# Patient Record
Sex: Male | Born: 1965 | Race: Black or African American | Hispanic: No | Marital: Single | Smoking: Former smoker
Health system: Southern US, Community
[De-identification: ages and names within clinical notes are randomized; demographics above are authoritative.]

## PROBLEM LIST (undated history)

## (undated) DIAGNOSIS — I5022 Chronic systolic (congestive) heart failure: Secondary | ICD-10-CM

## (undated) DIAGNOSIS — N183 Chronic kidney disease, stage 3 unspecified: Secondary | ICD-10-CM

## (undated) DIAGNOSIS — I10 Essential (primary) hypertension: Secondary | ICD-10-CM

## (undated) DIAGNOSIS — I429 Cardiomyopathy, unspecified: Secondary | ICD-10-CM

## (undated) DIAGNOSIS — N200 Calculus of kidney: Secondary | ICD-10-CM

## (undated) HISTORY — PX: NO PAST SURGERIES: SHX2092

---

## 2008-11-27 ENCOUNTER — Emergency Department (HOSPITAL_COMMUNITY): Admission: EM | Admit: 2008-11-27 | Discharge: 2008-11-27 | Payer: Self-pay | Admitting: Emergency Medicine

## 2012-02-20 ENCOUNTER — Emergency Department (HOSPITAL_COMMUNITY)
Admission: EM | Admit: 2012-02-20 | Discharge: 2012-02-20 | Discharge status: Against Medical Advice | Payer: PRIVATE HEALTH INSURANCE | Attending: Emergency Medicine | Admitting: Emergency Medicine

## 2012-02-20 ENCOUNTER — Encounter (HOSPITAL_COMMUNITY): Payer: Self-pay | Admitting: Emergency Medicine

## 2012-02-20 DIAGNOSIS — R109 Unspecified abdominal pain: Secondary | ICD-10-CM | POA: Insufficient documentation

## 2012-02-20 DIAGNOSIS — N200 Calculus of kidney: Secondary | ICD-10-CM | POA: Insufficient documentation

## 2012-02-20 DIAGNOSIS — Z87442 Personal history of urinary calculi: Secondary | ICD-10-CM | POA: Insufficient documentation

## 2012-02-20 HISTORY — DX: Calculus of kidney: N20.0

## 2012-02-20 LAB — CBC WITH DIFFERENTIAL/PLATELET
Basophils Absolute: 0.1 10*3/uL (ref 0.0–0.1)
Eosinophils Relative: 2 % (ref 0–5)
HCT: 36.7 % — ABNORMAL LOW (ref 39.0–52.0)
Lymphocytes Relative: 39 % (ref 12–46)
MCHC: 32.4 g/dL (ref 30.0–36.0)
MCV: 66.8 fL — ABNORMAL LOW (ref 78.0–100.0)
Monocytes Absolute: 0.4 10*3/uL (ref 0.1–1.0)
Monocytes Relative: 6 % (ref 3–12)
RDW: 17 % — ABNORMAL HIGH (ref 11.5–15.5)
WBC: 6.1 10*3/uL (ref 4.0–10.5)

## 2012-02-20 LAB — BASIC METABOLIC PANEL
BUN: 18 mg/dL (ref 6–23)
CO2: 31 mEq/L (ref 19–32)
GFR calc non Af Amer: 54 mL/min — ABNORMAL LOW (ref >90)
Glucose, Bld: 135 mg/dL — ABNORMAL HIGH (ref 70–99)
Potassium: 2.7 mEq/L — CL (ref 3.5–5.1)

## 2012-02-20 LAB — URINALYSIS, ROUTINE W REFLEX MICROSCOPIC
Bilirubin Urine: NEGATIVE
Glucose, UA: 100 mg/dL — AB
Hgb urine dipstick: NEGATIVE
Specific Gravity, Urine: 1.021 (ref 1.005–1.030)
Urobilinogen, UA: 2 mg/dL — ABNORMAL HIGH (ref 0.0–1.0)
pH: 6.5 (ref 5.0–8.0)

## 2012-02-20 NOTE — ED Notes (Signed)
Patient not found in waiting.

## 2012-02-20 NOTE — ED Notes (Signed)
The pts  Pot was  Low was called  And he had just left.  Will call and get the pt to return or follow up with his doctor

## 2012-02-20 NOTE — ED Notes (Signed)
Pt c/o left flank pain x 1 hour; pt sts hx of kidney stone and feels same

## 2013-05-13 ENCOUNTER — Inpatient Hospital Stay (HOSPITAL_COMMUNITY)
Admission: EM | Admit: 2013-05-13 | Discharge: 2013-05-14 | DRG: 684 | Disposition: A | Discharge status: Home / Self Care | Payer: PRIVATE HEALTH INSURANCE | Attending: Internal Medicine | Admitting: Internal Medicine

## 2013-05-13 ENCOUNTER — Encounter (HOSPITAL_COMMUNITY): Payer: Self-pay | Admitting: Emergency Medicine

## 2013-05-13 ENCOUNTER — Emergency Department (HOSPITAL_COMMUNITY): Payer: PRIVATE HEALTH INSURANCE

## 2013-05-13 DIAGNOSIS — R200 Anesthesia of skin: Secondary | ICD-10-CM | POA: Diagnosis present

## 2013-05-13 DIAGNOSIS — F17200 Nicotine dependence, unspecified, uncomplicated: Secondary | ICD-10-CM | POA: Diagnosis present

## 2013-05-13 DIAGNOSIS — N289 Disorder of kidney and ureter, unspecified: Secondary | ICD-10-CM

## 2013-05-13 DIAGNOSIS — R202 Paresthesia of skin: Secondary | ICD-10-CM

## 2013-05-13 DIAGNOSIS — I509 Heart failure, unspecified: Secondary | ICD-10-CM | POA: Diagnosis present

## 2013-05-13 DIAGNOSIS — R209 Unspecified disturbances of skin sensation: Secondary | ICD-10-CM | POA: Diagnosis present

## 2013-05-13 DIAGNOSIS — I10 Essential (primary) hypertension: Secondary | ICD-10-CM | POA: Diagnosis present

## 2013-05-13 DIAGNOSIS — F1410 Cocaine abuse, uncomplicated: Secondary | ICD-10-CM | POA: Diagnosis present

## 2013-05-13 DIAGNOSIS — N189 Chronic kidney disease, unspecified: Secondary | ICD-10-CM | POA: Diagnosis present

## 2013-05-13 DIAGNOSIS — R0602 Shortness of breath: Secondary | ICD-10-CM | POA: Diagnosis present

## 2013-05-13 DIAGNOSIS — R0600 Dyspnea, unspecified: Secondary | ICD-10-CM

## 2013-05-13 DIAGNOSIS — F1210 Cannabis abuse, uncomplicated: Secondary | ICD-10-CM | POA: Diagnosis present

## 2013-05-13 DIAGNOSIS — I129 Hypertensive chronic kidney disease with stage 1 through stage 4 chronic kidney disease, or unspecified chronic kidney disease: Principal | ICD-10-CM | POA: Diagnosis present

## 2013-05-13 DIAGNOSIS — E876 Hypokalemia: Secondary | ICD-10-CM

## 2013-05-13 LAB — CBC WITH DIFFERENTIAL/PLATELET
Basophils Relative: 1 % (ref 0–1)
Eosinophils Relative: 4 % (ref 0–5)
HCT: 36.1 % — ABNORMAL LOW (ref 39.0–52.0)
Hemoglobin: 12.4 g/dL — ABNORMAL LOW (ref 13.0–17.0)
Lymphs Abs: 1.7 10*3/uL (ref 0.7–4.0)
MCH: 22.3 pg — ABNORMAL LOW (ref 26.0–34.0)
MCV: 64.8 fL — ABNORMAL LOW (ref 78.0–100.0)
Monocytes Absolute: 0.3 10*3/uL (ref 0.1–1.0)
Monocytes Relative: 5 % (ref 3–12)
RBC: 5.57 MIL/uL (ref 4.22–5.81)
WBC: 5.6 10*3/uL (ref 4.0–10.5)

## 2013-05-13 LAB — POCT I-STAT, CHEM 8
BUN: 19 mg/dL (ref 6–23)
Calcium, Ion: 1.21 mmol/L (ref 1.12–1.23)
Chloride: 101 mEq/L (ref 96–112)
Creatinine, Ser: 1.7 mg/dL — ABNORMAL HIGH (ref 0.50–1.35)
TCO2: 26 mmol/L (ref 0–100)

## 2013-05-13 MED ORDER — SODIUM CHLORIDE 0.9 % IV SOLN
INTRAVENOUS | Status: DC
  Administered 2013-05-14: 1000 mL via INTRAVENOUS

## 2013-05-13 NOTE — ED Notes (Signed)
MD at bedside. 

## 2013-05-13 NOTE — ED Notes (Addendum)
EKG completed and given to Dr. Denton Lank

## 2013-05-13 NOTE — ED Notes (Signed)
Pt states he has experienced SOB for past couple of weeks; states tonight he was lying down and lost feeling in right leg for about 5 mins.; denies hx heart problems. States he has Sob and fatigue when not walking very far distance. Denies major health issues. Pt states he is an occasional smoker.

## 2013-05-13 NOTE — ED Provider Notes (Addendum)
CSN: [id number]     Arrival date & time 05/13/13  2028 History   First MD Initiated Contact with Patient 05/13/13 2327     Chief Complaint  Patient presents with  . Shortness of Breath   (Consider location/radiation/quality/duration/timing/severity/associated sxs/prior Treatment) Patient is a 47 y.o. male presenting with shortness of breath. The history is provided by the patient.  Shortness of Breath Associated symptoms: no abdominal pain, no chest pain, no fever, no headaches, no neck pain, no rash, no sore throat and no vomiting   pt states was lying at home tonight, in somewhat awkward position, but then noticed right leg numbness.  Diffuse right leg. Lasted 2-3 minutes. No arm numbness/weakness. Pt denies headache. No change in speech or vision.  Pt states also notes dyspnea w exertion, fatigue for the past 1-2 weeks. Denies cp. Denies increased leg swelling/pain. No orthopnea or pnd. Denies hx cad or chf. No fam hx cad. Denies hx htn or taking bp meds previously. Denies drug use x thc.  No cocaine. Denies leg pain or swelling. No immobility, surgery, travel or hx dvt or pe.        Past Medical History  Diagnosis Date  . Kidney stone    History reviewed. No pertinent past surgical history. History reviewed. No pertinent family history. History  Substance Use Topics  . Smoking status: Current Some Day Smoker  . Smokeless tobacco: Not on file  . Alcohol Use: No    Review of Systems  Constitutional: Negative for fever and chills.  HENT: Negative for sore throat.   Eyes: Negative for visual disturbance.  Respiratory: Positive for shortness of breath.   Cardiovascular: Negative for chest pain, palpitations and leg swelling.  Gastrointestinal: Negative for vomiting and abdominal pain.  Genitourinary: Negative for flank pain.  Musculoskeletal: Negative for back pain and neck pain.  Skin: Negative for rash.  Neurological: Positive for numbness. Negative for weakness and  headaches.  Hematological: Does not bruise/bleed easily.  Psychiatric/Behavioral: Negative for confusion.    Allergies  Review of patient's allergies indicates no known allergies.  Home Medications  No current outpatient prescriptions on file. BP 189/125  Pulse 100  Temp(Src) 98.8 F (37.1 C) (Oral)  Resp 18  Ht 5\' 11"  (1.803 m)  Wt 245 lb (111.131 kg)  BMI 34.19 kg/m2  SpO2 98% Physical Exam  Nursing note and vitals reviewed. Constitutional: He is oriented to person, place, and time. He appears well-developed and well-nourished. No distress.  HENT:  Head: Atraumatic.  Eyes: Conjunctivae are normal. Pupils are equal, round, and reactive to light.  Neck: Neck supple. No tracheal deviation present. No thyromegaly present.  No bruit  Cardiovascular: Normal rate, regular rhythm, normal heart sounds and intact distal pulses.   Pulmonary/Chest: Effort normal and breath sounds normal. No accessory muscle usage. No respiratory distress.  Abdominal: Soft. Bowel sounds are normal. He exhibits no distension. There is no tenderness.  Musculoskeletal: Normal range of motion. He exhibits no edema and no tenderness.  Neurological: He is alert and oriented to person, place, and time. No cranial nerve deficit.  No pronator drift. Motor 5/5 bil upper and lower ext. Sym reflexes.   Skin: Skin is warm and dry. He is not diaphoretic.  Psychiatric: He has a normal mood and affect.    ED Course  Procedures (including critical care time)   Results for orders placed during the hospital encounter of 05/13/13  CBC WITH DIFFERENTIAL      Result Value Range  WBC 5.6  4.0 - 10.5 K/uL   RBC 5.57  4.22 - 5.81 MIL/uL   Hemoglobin 12.4 (*) 13.0 - 17.0 g/dL   HCT 16.1 (*) 09.6 - 04.5 %   MCV 64.8 (*) 78.0 - 100.0 fL   MCH 22.3 (*) 26.0 - 34.0 pg   MCHC 34.3  30.0 - 36.0 g/dL   RDW 40.9 (*) 81.1 - 91.4 %   Platelets 240  150 - 400 K/uL   Neutrophils Relative % 60  43 - 77 %   Lymphocytes Relative  30  12 - 46 %   Monocytes Relative 5  3 - 12 %   Eosinophils Relative 4  0 - 5 %   Basophils Relative 1  0 - 1 %   Neutro Abs 3.3  1.7 - 7.7 K/uL   Lymphs Abs 1.7  0.7 - 4.0 K/uL   Monocytes Absolute 0.3  0.1 - 1.0 K/uL   Eosinophils Absolute 0.2  0.0 - 0.7 K/uL   Basophils Absolute 0.1  0.0 - 0.1 K/uL   Smear Review MORPHOLOGY UNREMARKABLE    TROPONIN I      Result Value Range   Troponin I <0.30  <0.30 ng/mL  PRO B NATRIURETIC PEPTIDE      Result Value Range   Pro B Natriuretic peptide (BNP) 1513.0 (*) 0 - 125 pg/mL  URINE RAPID DRUG SCREEN (HOSP PERFORMED)      Result Value Range   Opiates NONE DETECTED  NONE DETECTED   Cocaine POSITIVE (*) NONE DETECTED   Benzodiazepines NONE DETECTED  NONE DETECTED   Amphetamines NONE DETECTED  NONE DETECTED   Tetrahydrocannabinol NONE DETECTED  NONE DETECTED   Barbiturates NONE DETECTED  NONE DETECTED  POCT I-STAT, CHEM 8      Result Value Range   Sodium 143  135 - 145 mEq/L   Potassium 3.0 (*) 3.5 - 5.1 mEq/L   Chloride 101  96 - 112 mEq/L   BUN 19  6 - 23 mg/dL   Creatinine, Ser 7.82 (*) 0.50 - 1.35 mg/dL   Glucose, Bld 956 (*) 70 - 99 mg/dL   Calcium, Ion 2.13  0.86 - 1.23 mmol/L   TCO2 26  0 - 100 mmol/L   Hemoglobin 14.3  13.0 - 17.0 g/dL   HCT 57.8  46.9 - 62.9 %   Dg Chest 2 View  05/13/2013   CLINICAL DATA:  Shortness of breath  EXAM: CHEST  2 VIEW  COMPARISON:  None.  FINDINGS: The lungs are clear. Heart is upper normal in size with normal pulmonary vascularity. No adenopathy. No bone lesions.  IMPRESSION: No edema or consolidation.   Electronically Signed   By: Bretta Bang M.D.   On: 05/13/2013 21:12   Ct Head Wo Contrast  05/14/2013   CLINICAL DATA:  Increasing shortness of breath. Right leg numbness. Difficulty walking.  EXAM: CT HEAD WITHOUT CONTRAST  TECHNIQUE: Contiguous axial images were obtained from the base of the skull through the vertex without intravenous contrast.  COMPARISON:  No priors.  FINDINGS: No acute  intracranial abnormalities. Specifically, no evidence of acute intracranial hemorrhage, no definite findings of acute/subacute cerebral ischemia, no mass, mass effect, hydrocephalus or abnormal intra or extra-axial fluid collections. Visualized paranasal sinuses and mastoids are well pneumatized, with exception of some mild mucosal thickening throughout the posterior ethmoid sinuses bilaterally. No acute displaced skull fractures are identified.  IMPRESSION: * No acute intracranial abnormalities. *The appearance of the brain is normal.  Electronically Signed   By: Trudie Reed M.D.   On: 05/14/2013 01:39      EKG Interpretation   None       MDM   Iv ns. Labs. Ct.   Reviewed nursing notes and prior charts for additional history.    Date: 05/13/2013  Rate: 103  Rhythm: sinus tachycardia  QRS Axis: left  Intervals: normal  ST/T Wave abnormalities: nonspecific ST/T changes  Conduction Disutrbances:left anterior fascicular block  Narrative Interpretation:   Old EKG Reviewed: none available   Unclear whether earlier, brief, neuro symptoms (right leg numbness) related to tia, vs positional/nerve compression as pt notes was in awkward position at time.  Symptoms have resolved. No current neuro c/o.  Pt w uncontrolled bp.  Hx renal insuff on prior labs, sl higher today.   uds pos cocaine.    Recheck bp still elev.  Hydralazine iv, ntg paste, ativan 1 mg iv.  Med service called to admit.   Pt had earlier agreed but now refusing admission.  hospitalist indicates he tried to persuade to stay, discussed risks, but pt refusing to stay.   I again (second occasion) discussed reasons for admission w pt, and risks of leaving AMA, including stroke, permanent neurologist deficit/paralysis, heart attack, worsening kidney function/permanent kidney damage/dialyses, and death.   Pt voices understanding of risk, reason for recommendation of admission, but refuses to stay either in hospital or  in ED.  Pt is alert, oriented, and comprehends risks of leaving, but persists in refusing to stay, requesting release.  Will sign out ama and give rx bp med and referral for close follow up, to return to ED right away if reconsiders, willing to stay, etc.    Suzi Roots, MD 05/14/13 1610  Suzi Roots, MD 05/14/13 0425

## 2013-05-13 NOTE — ED Notes (Signed)
Patient states he is just not feeling right.  Patient states that he has been having increased shortness of breath, especially with exercion, which is not normal for him.  Today, right leg went numb on him, he was having some trouble walking.  Patient now with resolved symptoms of the leg numbness.

## 2013-05-14 ENCOUNTER — Emergency Department (HOSPITAL_COMMUNITY): Payer: PRIVATE HEALTH INSURANCE

## 2013-05-14 ENCOUNTER — Encounter (HOSPITAL_COMMUNITY): Payer: Self-pay | Admitting: Radiology

## 2013-05-14 ENCOUNTER — Inpatient Hospital Stay (HOSPITAL_COMMUNITY): Payer: PRIVATE HEALTH INSURANCE

## 2013-05-14 DIAGNOSIS — I10 Essential (primary) hypertension: Secondary | ICD-10-CM

## 2013-05-14 DIAGNOSIS — R0602 Shortness of breath: Secondary | ICD-10-CM | POA: Diagnosis present

## 2013-05-14 DIAGNOSIS — R209 Unspecified disturbances of skin sensation: Secondary | ICD-10-CM

## 2013-05-14 DIAGNOSIS — N289 Disorder of kidney and ureter, unspecified: Secondary | ICD-10-CM

## 2013-05-14 DIAGNOSIS — R200 Anesthesia of skin: Secondary | ICD-10-CM | POA: Diagnosis present

## 2013-05-14 DIAGNOSIS — R0609 Other forms of dyspnea: Secondary | ICD-10-CM

## 2013-05-14 DIAGNOSIS — F1410 Cocaine abuse, uncomplicated: Secondary | ICD-10-CM

## 2013-05-14 LAB — COMPREHENSIVE METABOLIC PANEL
Albumin: 3.3 g/dL — ABNORMAL LOW (ref 3.5–5.2)
Alkaline Phosphatase: 67 U/L (ref 39–117)
BUN: 15 mg/dL (ref 6–23)
Calcium: 8.8 mg/dL (ref 8.4–10.5)
GFR calc Af Amer: 71 mL/min — ABNORMAL LOW (ref >90)
Glucose, Bld: 122 mg/dL — ABNORMAL HIGH (ref 70–99)
Potassium: 3 mEq/L — ABNORMAL LOW (ref 3.5–5.1)
Total Protein: 6.8 g/dL (ref 6.0–8.3)

## 2013-05-14 LAB — LIPID PANEL
Cholesterol: 136 mg/dL (ref 0–200)
HDL: 49 mg/dL (ref >39)
LDL Cholesterol: 79 mg/dL (ref 0–99)
Triglycerides: 39 mg/dL (ref <150)

## 2013-05-14 LAB — CBC WITH DIFFERENTIAL/PLATELET
Basophils Absolute: 0.1 10*3/uL (ref 0.0–0.1)
HCT: 35.6 % — ABNORMAL LOW (ref 39.0–52.0)
Lymphocytes Relative: 26 % (ref 12–46)
MCH: 22.1 pg — ABNORMAL LOW (ref 26.0–34.0)
MCHC: 34 g/dL (ref 30.0–36.0)
Monocytes Absolute: 0.3 10*3/uL (ref 0.1–1.0)
Monocytes Relative: 6 % (ref 3–12)
Neutro Abs: 3.6 10*3/uL (ref 1.7–7.7)
RDW: 17.1 % — ABNORMAL HIGH (ref 11.5–15.5)
WBC: 5.7 10*3/uL (ref 4.0–10.5)

## 2013-05-14 LAB — URINE MICROSCOPIC-ADD ON

## 2013-05-14 LAB — URINALYSIS, ROUTINE W REFLEX MICROSCOPIC
Bilirubin Urine: NEGATIVE
Glucose, UA: 100 mg/dL — AB
Leukocytes, UA: NEGATIVE
Nitrite: NEGATIVE
Specific Gravity, Urine: 1.014 (ref 1.005–1.030)
pH: 7.5 (ref 5.0–8.0)

## 2013-05-14 LAB — TSH: TSH: 1.08 u[IU]/mL (ref 0.350–4.500)

## 2013-05-14 LAB — RAPID URINE DRUG SCREEN, HOSP PERFORMED
Amphetamines: NOT DETECTED
Barbiturates: NOT DETECTED
Benzodiazepines: NOT DETECTED
Cocaine: POSITIVE — AB

## 2013-05-14 LAB — PRO B NATRIURETIC PEPTIDE: Pro B Natriuretic peptide (BNP): 1513 pg/mL — ABNORMAL HIGH (ref 0–125)

## 2013-05-14 LAB — TROPONIN I: Troponin I: <0.3 ng/mL (ref <0.30)

## 2013-05-14 MED ORDER — ONDANSETRON HCL 4 MG PO TABS: 4.0000 mg | ORAL_TABLET | Freq: Four times a day (QID) | ORAL | Status: DC | PRN

## 2013-05-14 MED ORDER — AMLODIPINE BESYLATE 10 MG PO TABS: 10.0000 mg | ORAL_TABLET | Freq: Every day | ORAL | Status: DC

## 2013-05-14 MED ORDER — NITROGLYCERIN 0.4 MG SL SUBL: 0.4000 mg | SUBLINGUAL_TABLET | SUBLINGUAL | Status: DC | PRN

## 2013-05-14 MED ORDER — ONDANSETRON HCL 4 MG/2ML IJ SOLN: 4.0000 mg | Freq: Four times a day (QID) | INTRAMUSCULAR | Status: DC | PRN

## 2013-05-14 MED ORDER — LORAZEPAM 2 MG/ML IJ SOLN
1.0000 mg | Freq: Once | INTRAMUSCULAR | Status: AC
  Administered 2013-05-14: 1 mg via INTRAVENOUS
  Filled 2013-05-14: qty 1

## 2013-05-14 MED ORDER — POTASSIUM CHLORIDE CRYS ER 20 MEQ PO TBCR
40.0000 meq | EXTENDED_RELEASE_TABLET | Freq: Once | ORAL | Status: AC
  Administered 2013-05-14: 40 meq via ORAL
  Filled 2013-05-14: qty 2

## 2013-05-14 MED ORDER — LORAZEPAM 2 MG/ML IJ SOLN: 1.0000 mg | Freq: Once | INTRAMUSCULAR | Status: DC

## 2013-05-14 MED ORDER — AMLODIPINE BESYLATE 10 MG PO TABS
10.0000 mg | ORAL_TABLET | Freq: Every day | ORAL | Status: DC
  Administered 2013-05-14: 10 mg via ORAL
  Filled 2013-05-14: qty 1

## 2013-05-14 MED ORDER — AMLODIPINE BESYLATE 5 MG PO TABS
5.0000 mg | ORAL_TABLET | Freq: Every day | ORAL | Status: DC
  Filled 2013-05-14 (×2): qty 1

## 2013-05-14 MED ORDER — HYDRALAZINE HCL 20 MG/ML IJ SOLN: 10.0000 mg | INTRAMUSCULAR | Status: DC | PRN

## 2013-05-14 MED ORDER — SODIUM CHLORIDE 0.9 % IJ SOLN
3.0000 mL | Freq: Two times a day (BID) | INTRAMUSCULAR | Status: DC
  Administered 2013-05-14: 3 mL via INTRAVENOUS

## 2013-05-14 MED ORDER — NITROGLYCERIN 2 % TD OINT
1.0000 [in_us] | TOPICAL_OINTMENT | Freq: Four times a day (QID) | TRANSDERMAL | Status: DC
  Administered 2013-05-14: 1 [in_us] via TOPICAL
  Filled 2013-05-14: qty 1

## 2013-05-14 MED ORDER — SODIUM CHLORIDE 0.9 % IJ SOLN: 3.0000 mL | Freq: Two times a day (BID) | INTRAMUSCULAR | Status: DC

## 2013-05-14 MED ORDER — ACETAMINOPHEN 325 MG PO TABS: 650.0000 mg | ORAL_TABLET | Freq: Four times a day (QID) | ORAL | Status: DC | PRN

## 2013-05-14 MED ORDER — ACETAMINOPHEN 650 MG RE SUPP: 650.0000 mg | Freq: Four times a day (QID) | RECTAL | Status: DC | PRN

## 2013-05-14 MED ORDER — HYDRALAZINE HCL 20 MG/ML IJ SOLN
10.0000 mg | Freq: Once | INTRAMUSCULAR | Status: AC
  Administered 2013-05-14: 10 mg via INTRAVENOUS
  Filled 2013-05-14: qty 1

## 2013-05-14 NOTE — ED Notes (Signed)
Pt now would like to stay for admission. Dr. Kirtland Bouchard made aware. Verbal order for 1mg  ativan given.

## 2013-05-14 NOTE — Progress Notes (Addendum)
Patient left AMA. Dr. Isidoro Donning notified.

## 2013-05-14 NOTE — Discharge Summary (Signed)
Physician  - AMA NOTE      Please note that patient left AGAINST MEDICAL ADVICE   Patient ID: Derek Chapman MRN: [id number] DOB/AGE: 1965-11-14 47 y.o.  Admit date: 05/13/2013 Discharge date: Left against medical advice  Primary Care Physician:  No PCP Per Patient  Discharge Diagnoses:    . SOB (shortness of breath) . Numbness in right leg . Accelerated hypertension Polysubstance abuse, cocaine positive in urine drug screen  Consults: None  Allergies:  No Known Allergies   Discharge Medications: NONE, PATIENT LEFT AMA     Brief H and P: For complete details please refer to admission H and P, but in brief Derek Chapman is a 47 y.o. male history of hypertension on no medications presently presented to the ER because patient felt numb in his right lower extremity with mild weakness last night around 9 PM. Patient's numbness and weakness of his right lower extremity resolved within 5 minutes. Since his symptoms was concerning he came to the ER. CT head did not show anything acute. In addition patient stated he had been feeling short of breath for last 3 weeks particularly on exertion. Denied any chest pain productive cough fever chills nausea vomiting headache abdominal pain. In the ER BNP is found to be elevated. Patient's blood pressure also was found to be elevated which improved with nitroglycerin patch and hydralazine when necessary and Ativan was given as patient was found to have cocaine positive. 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 BNP was elevated  at 1513. BP was controlled with nitroglycerin patch, hydralazine and amlodipine. 2-D echo was ordered however patient did not stay for the procedures. Patient was explained the risk of heart attack, MI, death with his continued behavior, cocaine positive, accelerated  hypertension without treatment and no followup as outpatient.      Numbness in right leg: Symptoms resolved, Dr. Toniann Fail discussed with the Dr. Roseanne Reno from neurology who recommended MRI of the brain to rule out stroke however since his symptoms are resolved, it could be due to accelerated hypertension, patient did not want the MRI and left AMA. Dr. Roseanne Reno had mentioned that if MRI is positive for stroke patient will need full stroke workup. Patient was explained the risk of possible stroke with his polysubstance abuse, hyper tension    Accelerated hypertension: BP improved with nitroglycerin, hydralazine, amlodipine however patient left AMA without any prescriptions   Day of Discharge BP 151/109  Pulse 84  Temp(Src) 97.6 F (36.4 C) (Oral)  Resp 25  Ht 5\' 11"  (1.803 m)  Wt 111.131 kg (245 lb)  BMI 34.19 kg/m2  SpO2 99%  Physical Exam: General: Alert and awake oriented x3 not in any acute distress. CVS: S1-S2 clear no murmur rubs or gallops Chest: clear to auscultation bilaterally, no wheezing rales or rhonchi Abdomen: soft nontender, nondistended, normal bowel sounds, no organomegaly Extremities: no cyanosis, clubbing or edema noted bilaterally Neuro: Cranial nerves II-XII intact, no focal neurological deficits   The results of significant diagnostics from this hospitalization (including imaging, microbiology, ancillary and laboratory) are listed below for reference.    LAB RESULTS: Basic Metabolic Panel:  Recent Labs Lab 05/13/13 2058 05/14/13 0555  NA 143 140  K 3.0* 3.0*  CL 101 102  CO2  --  29  GLUCOSE 159* 122*  BUN 19 15  CREATININE 1.70* 1.36*  CALCIUM  --  8.8   Liver Function Tests:  Recent Labs Lab 05/14/13 0555  AST 22  ALT 38  ALKPHOS 67  BILITOT 0.5  PROT 6.8  ALBUMIN 3.3*   CBC:  Recent Labs Lab 05/13/13 2043 05/13/13 2058 05/14/13 0555  WBC 5.6  --  5.7  NEUTROABS 3.3  --  3.6  HGB 12.4* 14.3 12.1*  HCT 36.1* 42.0 35.6*  MCV  64.8*  --  65.0*  PLT 240  --  232   Cardiac Enzymes:  Recent Labs Lab 05/13/13 2347 05/14/13 0555  TROPONINI <0.30 <0.30   BNP: No components found with this basename: POCBNP,  CBG:  Recent Labs Lab 05/14/13 1152  GLUCAP 109*    Significant Diagnostic Studies:  Dg Chest 2 View  05/13/2013   CLINICAL DATA:  Shortness of breath  EXAM: CHEST  2 VIEW  COMPARISON:  None.  FINDINGS: The lungs are clear. Heart is upper normal in size with normal pulmonary vascularity. No adenopathy. No bone lesions.  IMPRESSION: No edema or consolidation.   Electronically Signed   By: Bretta Bang M.D.   On: 05/13/2013 21:12   Ct Head Wo Contrast  05/14/2013   CLINICAL DATA:  Increasing shortness of breath. Right leg numbness. Difficulty walking.  EXAM: CT HEAD WITHOUT CONTRAST  TECHNIQUE: Contiguous axial images were obtained from the base of the skull through the vertex without intravenous contrast.  COMPARISON:  No priors.  FINDINGS: No acute intracranial abnormalities. Specifically, no evidence of acute intracranial hemorrhage, no definite findings of acute/subacute cerebral ischemia, no mass, mass effect, hydrocephalus or abnormal intra or extra-axial fluid collections. Visualized paranasal sinuses and mastoids are well pneumatized, with exception of some mild mucosal thickening throughout the posterior ethmoid sinuses bilaterally. No acute displaced skull fractures are identified.  IMPRESSION: * No acute intracranial abnormalities. *The appearance of the brain is normal.   Electronically Signed   By: Trudie Reed M.D.   On: 05/14/2013 01:39      Disposition and Follow-up: NONE AS PATIENT LEFT AMA  Signed:   Carrick Rijos M.D. [facility] 05/14/2013, 1:40 PM Pager: [phone]

## 2013-05-14 NOTE — H&P (Signed)
[facility] History and Physical  Derek Chapman KVQ:[id number] DOB: [birth year] DOA: 05/13/2013  Referring physician: ER physician. PCP: No PCP Per Patient   Chief Complaint: Shortness of breath and right lower extremity numbness.  HPI: Derek Chapman is a 47 y.o. male history of hypertension on no medications presently presents to the ER because patient felt numb in his right lower extremity with mild weakness last night around 9 PM. Patient's numbness and weakness of his right lower extremity resolved within 5 minutes. Since his symptoms was concerning he came to the ER. CT head did not show anything acute. In addition patient states he has been feeling short of breath for last 3 weeks particularly on exertion. Denies any chest pain productive cough fever chills nausea vomiting headache abdominal pain. In the ER BNP is found we elevated. Patient's blood pressure also was found to be elevated which improved with nitroglycerin patch and hydralazine when necessary and Ativan was given as patient was found to have cocaine positive. Patient has been admitted for further management.   Review of Systems: As presented in the history of presenting illness, rest negative.  Past Medical History  Diagnosis Date  . Kidney stone    Past Surgical History  Procedure Laterality Date  . No past surgeries     Social History:  reports that he has been smoking.  He does not have any smokeless tobacco history on file. He reports that he uses illicit drugs (Marijuana and Cocaine). He reports that he does not drink alcohol. Where does patient live home. Can patient participate in ADLs? Yes.  No Known Allergies  Family History:  Family History  Problem Relation Age of Onset  . Other Neg Hx       Prior to Admission medications   Medication Sig Start Date End Date Taking? Authorizing Provider  amLODipine (NORVASC) 10 MG tablet Take 1 tablet (10 mg total) by mouth daily. 05/14/13   Suzi Roots, MD     Physical Exam: Filed Vitals:   05/14/13 0400 05/14/13 0415 05/14/13 0456 05/14/13 0500  BP: 158/109 167/116    Pulse: 87 95    Temp:   97.6 F (36.4 C) 97.6 F (36.4 C)  TempSrc:      Resp: 24 23    Height:      Weight:      SpO2: 97% 100%       General:  Well-developed well-nourished.  Eyes: Anicteric no pallor.  ENT: No discharge from the ears eyes nose mouth.  Neck: No mass felt.  Cardiovascular: S1-S2 heard.  Respiratory: No rhonchi or crepitations.  Abdomen: Soft nontender bowel sounds present.  Skin: No rash.  Musculoskeletal: No edema.  Psychiatric: Appears normal.  Neurologic: Alert awake oriented to time place and person. Moves all extremities.  Labs on Admission:  Basic Metabolic Panel:  Recent Labs Lab 05/13/13 2058  NA 143  K 3.0*  CL 101  GLUCOSE 159*  BUN 19  CREATININE 1.70*   Liver Function Tests: No results found for this basename: AST, ALT, ALKPHOS, BILITOT, PROT, ALBUMIN,  in the last 168 hours No results found for this basename: LIPASE, AMYLASE,  in the last 168 hours No results found for this basename: AMMONIA,  in the last 168 hours CBC:  Recent Labs Lab 05/13/13 2043 05/13/13 2058  WBC 5.6  --   NEUTROABS 3.3  --   HGB 12.4* 14.3  HCT 36.1* 42.0  MCV 64.8*  --   PLT 240  --  Cardiac Enzymes:  Recent Labs Lab 05/13/13 2347  TROPONINI <0.30    BNP (last 3 results)  Recent Labs  05/13/13 2347  PROBNP 1513.0*   CBG: No results found for this basename: GLUCAP,  in the last 168 hours  Radiological Exams on Admission: Dg Chest 2 View  05/13/2013   CLINICAL DATA:  Shortness of breath  EXAM: CHEST  2 VIEW  COMPARISON:  None.  FINDINGS: The lungs are clear. Heart is upper normal in size with normal pulmonary vascularity. No adenopathy. No bone lesions.  IMPRESSION: No edema or consolidation.   Electronically Signed   By: Bretta Bang M.D.   On: 05/13/2013 21:12   Ct Head Wo Contrast  05/14/2013    CLINICAL DATA:  Increasing shortness of breath. Right leg numbness. Difficulty walking.  EXAM: CT HEAD WITHOUT CONTRAST  TECHNIQUE: Contiguous axial images were obtained from the base of the skull through the vertex without intravenous contrast.  COMPARISON:  No priors.  FINDINGS: No acute intracranial abnormalities. Specifically, no evidence of acute intracranial hemorrhage, no definite findings of acute/subacute cerebral ischemia, no mass, mass effect, hydrocephalus or abnormal intra or extra-axial fluid collections. Visualized paranasal sinuses and mastoids are well pneumatized, with exception of some mild mucosal thickening throughout the posterior ethmoid sinuses bilaterally. No acute displaced skull fractures are identified.  IMPRESSION: * No acute intracranial abnormalities. *The appearance of the brain is normal.   Electronically Signed   By: Trudie Reed M.D.   On: 05/14/2013 01:39     Assessment/Plan Principal Problem:   SOB (shortness of breath) Active Problems:   Numbness in right leg   Accelerated hypertension   1. Shortness of breath - symptoms concerning for CHF. May be also secondary to uncontrolled blood pressure. Patient presently is placed on nitroglycerin patch and when necessary IV hydralazine. Amlodipine 5 mg daily has been added given that patient has had hypertension previously and was untreated. 2. Right lower extremity numbness and weakness presently resolved - I have discussed with on-call neurologist Dr. Roseanne Reno who has advised to get MRI brain and if positive for any stroke and then proceed further workup. Otherwise symptoms may be related to blood pressure. 3. Accelerated hypertension - see #1. 4. Cocaine and tobacco abuse - strongly advised to quit these habits. 5. Chronic kidney disease - check UA. Closely follow intake output and metabolic panel. Probably related to uncontrolled hypertension.    Code Status: Full code.  Family Communication: None.   Disposition Plan: Admit to inpatient.    Derek Chapman N. [facility] Pager [phone].  If 7PM-7AM, please contact night-coverage [email or web address] Password [email or web address] 05/14/2013, 5:06 AM

## 2013-05-14 NOTE — ED Notes (Signed)
Pt decided he wuold like to leave ama after speaking to Dr. Kirtland Bouchard. ED attending made aware.

## 2013-05-14 NOTE — ED Notes (Addendum)
Called report to 3W; request admitting MD to evaluate pt before pt is transported to floor due to BP. Admitting MD notified; Will come see pt as soon as he can.

## 2013-05-19 LAB — ALDOSTERONE + RENIN ACTIVITY W/ RATIO: PRA LC/MS/MS: 0.77 ng/mL/h (ref 0.25–5.82)

## 2014-03-03 ENCOUNTER — Emergency Department (HOSPITAL_COMMUNITY): Payer: PRIVATE HEALTH INSURANCE

## 2014-03-03 ENCOUNTER — Inpatient Hospital Stay (HOSPITAL_COMMUNITY)
Admission: EM | Admit: 2014-03-03 | Discharge: 2014-03-06 | DRG: 291 | Disposition: A | Discharge status: Home / Self Care | Payer: PRIVATE HEALTH INSURANCE | Attending: Internal Medicine | Admitting: Internal Medicine

## 2014-03-03 ENCOUNTER — Encounter (HOSPITAL_COMMUNITY): Payer: Self-pay | Admitting: Emergency Medicine

## 2014-03-03 DIAGNOSIS — I43 Cardiomyopathy in diseases classified elsewhere: Secondary | ICD-10-CM | POA: Diagnosis present

## 2014-03-03 DIAGNOSIS — F1410 Cocaine abuse, uncomplicated: Secondary | ICD-10-CM | POA: Diagnosis present

## 2014-03-03 DIAGNOSIS — E876 Hypokalemia: Secondary | ICD-10-CM | POA: Diagnosis present

## 2014-03-03 DIAGNOSIS — Z87891 Personal history of nicotine dependence: Secondary | ICD-10-CM

## 2014-03-03 DIAGNOSIS — G4730 Sleep apnea, unspecified: Secondary | ICD-10-CM | POA: Diagnosis present

## 2014-03-03 DIAGNOSIS — I10 Essential (primary) hypertension: Secondary | ICD-10-CM | POA: Diagnosis present

## 2014-03-03 DIAGNOSIS — R81 Glycosuria: Secondary | ICD-10-CM | POA: Insufficient documentation

## 2014-03-03 DIAGNOSIS — R9431 Abnormal electrocardiogram [ECG] [EKG]: Secondary | ICD-10-CM

## 2014-03-03 DIAGNOSIS — I5021 Acute systolic (congestive) heart failure: Secondary | ICD-10-CM | POA: Diagnosis present

## 2014-03-03 DIAGNOSIS — N183 Chronic kidney disease, stage 3 unspecified: Secondary | ICD-10-CM | POA: Diagnosis present

## 2014-03-03 DIAGNOSIS — E089 Diabetes mellitus due to underlying condition without complications: Secondary | ICD-10-CM

## 2014-03-03 DIAGNOSIS — F1210 Cannabis abuse, uncomplicated: Secondary | ICD-10-CM | POA: Diagnosis present

## 2014-03-03 DIAGNOSIS — I130 Hypertensive heart and chronic kidney disease with heart failure and stage 1 through stage 4 chronic kidney disease, or unspecified chronic kidney disease: Principal | ICD-10-CM | POA: Diagnosis present

## 2014-03-03 DIAGNOSIS — N039 Chronic nephritic syndrome with unspecified morphologic changes: Principal | ICD-10-CM

## 2014-03-03 DIAGNOSIS — I509 Heart failure, unspecified: Secondary | ICD-10-CM | POA: Diagnosis present

## 2014-03-03 DIAGNOSIS — E119 Type 2 diabetes mellitus without complications: Secondary | ICD-10-CM | POA: Diagnosis present

## 2014-03-03 DIAGNOSIS — N179 Acute kidney failure, unspecified: Secondary | ICD-10-CM | POA: Diagnosis present

## 2014-03-03 DIAGNOSIS — I2089 Other forms of angina pectoris: Secondary | ICD-10-CM | POA: Diagnosis present

## 2014-03-03 DIAGNOSIS — I209 Angina pectoris, unspecified: Secondary | ICD-10-CM | POA: Diagnosis present

## 2014-03-03 DIAGNOSIS — I208 Other forms of angina pectoris: Secondary | ICD-10-CM | POA: Diagnosis present

## 2014-03-03 DIAGNOSIS — R000 Tachycardia, unspecified: Secondary | ICD-10-CM | POA: Diagnosis present

## 2014-03-03 HISTORY — DX: Essential (primary) hypertension: I10

## 2014-03-03 LAB — RAPID URINE DRUG SCREEN, HOSP PERFORMED
Amphetamines: NOT DETECTED
Barbiturates: NOT DETECTED
Benzodiazepines: NOT DETECTED
COCAINE: NOT DETECTED
Opiates: NOT DETECTED
TETRAHYDROCANNABINOL: NOT DETECTED

## 2014-03-03 LAB — I-STAT TROPONIN, ED: TROPONIN I, POC: 0.07 ng/mL (ref 0.00–0.08)

## 2014-03-03 LAB — BASIC METABOLIC PANEL
Anion gap: 14 (ref 5–15)
BUN: 25 mg/dL — ABNORMAL HIGH (ref 6–23)
CO2: 25 mEq/L (ref 19–32)
Calcium: 9.2 mg/dL (ref 8.4–10.5)
Chloride: 100 mEq/L (ref 96–112)
Creatinine, Ser: 1.57 mg/dL — ABNORMAL HIGH (ref 0.50–1.35)
GFR calc Af Amer: 60 mL/min — ABNORMAL LOW (ref >90)
GFR calc non Af Amer: 52 mL/min — ABNORMAL LOW (ref >90)
GLUCOSE: 144 mg/dL — AB (ref 70–99)
POTASSIUM: 3.3 meq/L — AB (ref 3.7–5.3)
Sodium: 139 mEq/L (ref 137–147)

## 2014-03-03 LAB — CBC
HEMATOCRIT: 37.1 % — AB (ref 39.0–52.0)
Hemoglobin: 12.8 g/dL — ABNORMAL LOW (ref 13.0–17.0)
MCH: 22 pg — ABNORMAL LOW (ref 26.0–34.0)
MCHC: 34.5 g/dL (ref 30.0–36.0)
MCV: 63.9 fL — ABNORMAL LOW (ref 78.0–100.0)
Platelets: 216 10*3/uL (ref 150–400)
RBC: 5.81 MIL/uL (ref 4.22–5.81)
RDW: 17.6 % — ABNORMAL HIGH (ref 11.5–15.5)
WBC: 5.5 10*3/uL (ref 4.0–10.5)

## 2014-03-03 LAB — TSH: TSH: 1.88 u[IU]/mL (ref 0.350–4.500)

## 2014-03-03 LAB — TROPONIN I: Troponin I: <0.3 ng/mL (ref <0.30)

## 2014-03-03 LAB — D-DIMER, QUANTITATIVE (NOT AT ARMC): D-Dimer, Quant: 0.39 ug/mL-FEU (ref 0.00–0.48)

## 2014-03-03 LAB — PRO B NATRIURETIC PEPTIDE: Pro B Natriuretic peptide (BNP): 6303 pg/mL — ABNORMAL HIGH (ref 0–125)

## 2014-03-03 MED ORDER — ONDANSETRON HCL 4 MG/2ML IJ SOLN: 4.0000 mg | Freq: Four times a day (QID) | INTRAMUSCULAR | Status: DC | PRN

## 2014-03-03 MED ORDER — LISINOPRIL 2.5 MG PO TABS
2.5000 mg | ORAL_TABLET | Freq: Every day | ORAL | Status: DC
  Administered 2014-03-03: 2.5 mg via ORAL
  Filled 2014-03-03 (×2): qty 1

## 2014-03-03 MED ORDER — SODIUM CHLORIDE 0.9 % IV SOLN: 250.0000 mL | INTRAVENOUS | Status: DC | PRN

## 2014-03-03 MED ORDER — ACETAMINOPHEN 325 MG PO TABS: 650.0000 mg | ORAL_TABLET | ORAL | Status: DC | PRN

## 2014-03-03 MED ORDER — CARVEDILOL 6.25 MG PO TABS
6.2500 mg | ORAL_TABLET | Freq: Once | ORAL | Status: AC
  Administered 2014-03-03: 6.25 mg via ORAL
  Filled 2014-03-03: qty 1

## 2014-03-03 MED ORDER — ASPIRIN 81 MG PO CHEW
324.0000 mg | CHEWABLE_TABLET | Freq: Once | ORAL | Status: AC
  Administered 2014-03-03: 324 mg via ORAL
  Filled 2014-03-03: qty 4

## 2014-03-03 MED ORDER — SODIUM CHLORIDE 0.9 % IJ SOLN: 3.0000 mL | INTRAMUSCULAR | Status: DC | PRN

## 2014-03-03 MED ORDER — ASPIRIN EC 325 MG PO TBEC
325.0000 mg | DELAYED_RELEASE_TABLET | Freq: Every day | ORAL | Status: DC
  Administered 2014-03-04 – 2014-03-06 (×3): 325 mg via ORAL
  Filled 2014-03-03 (×3): qty 1

## 2014-03-03 MED ORDER — FUROSEMIDE 10 MG/ML IJ SOLN
80.0000 mg | Freq: Two times a day (BID) | INTRAMUSCULAR | Status: DC
  Administered 2014-03-03: 80 mg via INTRAVENOUS
  Filled 2014-03-03 (×3): qty 8

## 2014-03-03 MED ORDER — ENOXAPARIN SODIUM 40 MG/0.4ML ~~LOC~~ SOLN
40.0000 mg | SUBCUTANEOUS | Status: DC
  Administered 2014-03-03 – 2014-03-05 (×3): 40 mg via SUBCUTANEOUS
  Filled 2014-03-03 (×4): qty 0.4

## 2014-03-03 MED ORDER — FUROSEMIDE 10 MG/ML IJ SOLN
40.0000 mg | Freq: Once | INTRAMUSCULAR | Status: AC
  Administered 2014-03-03: 40 mg via INTRAVENOUS
  Filled 2014-03-03: qty 4

## 2014-03-03 MED ORDER — SODIUM CHLORIDE 0.9 % IJ SOLN
3.0000 mL | Freq: Two times a day (BID) | INTRAMUSCULAR | Status: DC
  Administered 2014-03-03 – 2014-03-06 (×6): 3 mL via INTRAVENOUS

## 2014-03-03 MED ORDER — NITROGLYCERIN 2 % TD OINT
1.0000 [in_us] | TOPICAL_OINTMENT | Freq: Four times a day (QID) | TRANSDERMAL | Status: DC
  Administered 2014-03-03 – 2014-03-04 (×3): 1 [in_us] via TOPICAL
  Filled 2014-03-03: qty 30

## 2014-03-03 NOTE — Consult Note (Signed)
Cardiologist:  New Reason for Consult:  Heart Failure Referring Physician:    Weston Kallman is an 48 y.o. male.  HPI:   Patient is a 48 year old male, who works as the Statistician at [employer].  He has a history of hypertension, kidney stones, cocaine and marijuana use, tobacco abuse from 2003 to about 2006.  He presents with extreme SOB, weakness, chest pressure with exertion and associated with a lot of nausea and more SOB.  6-7 pillow orthopnea, PND and LEE.  He reports recently that his coworkers noticed he had gained about 10-12 # and then suddenly lost it.  He feels he has gained it back.  He takes amlodipine and was supposed to be taking Atenolol/ chlorthalidone up until a month ago but it makes him feel very poorly.  He was supposed to have an echo last November but left AMA before the procedure.     Chest x-ray shows cardiomegaly with no acute disease. BNP is elevated at 6303.  EKG shows sinus tachycardia rate 110 beats per minute and lateral T-wave inversion.   Past Medical History  Diagnosis Date  . Kidney stone   . Hypertension     Past Surgical History  Procedure Laterality Date  . No past surgeries      Family History  Problem Relation Age of Onset  . Other Neg Hx   . Diabetes Mother   . Hypertension Father   . Cancer Father     Social History:  reports that he has quit smoking. He quit smokeless tobacco use about 9 years ago. He reports that he drinks alcohol. He reports that he uses illicit drugs (Marijuana and Cocaine).  Allergies: No Known Allergies  Medications:  Prior to Admission medications   Medication Sig Start Date End Date Taking? Authorizing Provider  albuterol (PROVENTIL HFA;VENTOLIN HFA) 108 (90 BASE) MCG/ACT inhaler Inhale 1-2 puffs into the lungs every 6 (six) hours as needed for wheezing or shortness of breath.   Yes Historical Provider, MD  amLODipine (NORVASC) 10 MG tablet Take 1 tablet (10 mg total) by mouth daily. 05/14/13  Yes  Ripudeep Krystal Eaton, MD     Results for orders placed during the hospital encounter of 03/03/14 (from the past 48 hour(s))  CBC     Status: Abnormal   Collection Time    03/03/14 12:10 PM      Result Value Ref Range   WBC 5.5  4.0 - 10.5 K/uL   RBC 5.81  4.22 - 5.81 MIL/uL   Hemoglobin 12.8 (*) 13.0 - 17.0 g/dL   HCT 37.1 (*) 39.0 - 52.0 %   MCV 63.9 (*) 78.0 - 100.0 fL   MCH 22.0 (*) 26.0 - 34.0 pg   MCHC 34.5  30.0 - 36.0 g/dL   RDW 17.6 (*) 11.5 - 15.5 %   Platelets 216  150 - 400 K/uL  BASIC METABOLIC PANEL     Status: Abnormal   Collection Time    03/03/14 12:10 PM      Result Value Ref Range   Sodium 139  137 - 147 mEq/L   Potassium 3.3 (*) 3.7 - 5.3 mEq/L   Chloride 100  96 - 112 mEq/L   CO2 25  19 - 32 mEq/L   Glucose, Bld 144 (*) 70 - 99 mg/dL   BUN 25 (*) 6 - 23 mg/dL   Creatinine, Ser 1.57 (*) 0.50 - 1.35 mg/dL   Calcium 9.2  8.4 - 10.5  mg/dL   GFR calc non Af Amer 52 (*) >90 mL/min   GFR calc Af Amer 60 (*) >90 mL/min   Comment: (NOTE)     The eGFR has been calculated using the CKD EPI equation.     This calculation has not been validated in all clinical situations.     eGFR's persistently <90 mL/min signify possible Chronic Kidney     Disease.   Anion gap 14  5 - 15  PRO B NATRIURETIC PEPTIDE     Status: Abnormal   Collection Time    03/03/14 12:10 PM      Result Value Ref Range   Pro B Natriuretic peptide (BNP) 6303.0 (*) 0 - 125 pg/mL  I-STAT TROPOININ, ED     Status: None   Collection Time    03/03/14 12:18 PM      Result Value Ref Range   Troponin i, poc 0.07  0.00 - 0.08 ng/mL   Comment 3            Comment: Due to the release kinetics of cTnI,     a negative result within the first hours     of the onset of symptoms does not rule out     myocardial infarction with certainty.     If myocardial infarction is still suspected,     repeat the test at appropriate intervals.  D-DIMER, QUANTITATIVE     Status: None   Collection Time    03/03/14 12:35 PM        Result Value Ref Range   D-Dimer, Quant 0.39  0.00 - 0.48 ug/mL-FEU   Comment:            AT THE INHOUSE ESTABLISHED CUTOFF     VALUE OF 0.48 ug/mL FEU,     THIS ASSAY HAS BEEN DOCUMENTED     IN THE LITERATURE TO HAVE     A SENSITIVITY AND NEGATIVE     PREDICTIVE VALUE OF AT LEAST     98 TO 99%.  THE TEST RESULT     SHOULD BE CORRELATED WITH     AN ASSESSMENT OF THE CLINICAL     PROBABILITY OF DVT / VTE.    Dg Chest 2 View  03/03/2014   CLINICAL DATA:  Fever.  EXAM: CHEST  2 VIEW  COMPARISON:  05/13/2013.  FINDINGS: The heart is enlarged. There are no infiltrates or failure. There is no effusion or pneumothorax. Bones are unremarkable. No change from priors.  IMPRESSION: Cardiomegaly, no active disease.   Electronically Signed   By: Rolla Flatten M.D.   On: 03/03/2014 13:43    Review of Systems  Constitutional: Negative for fever and diaphoresis.  HENT: Negative for congestion and sore throat.   Respiratory: Positive for cough (Mild) and shortness of breath. Negative for wheezing.   Cardiovascular: Positive for chest pain, orthopnea, leg swelling and PND. Negative for palpitations.  Gastrointestinal: Positive for nausea. Negative for vomiting, abdominal pain, blood in stool and melena.  Genitourinary: Negative for hematuria.       + polyuria  Musculoskeletal: Negative for myalgias.  Neurological: Positive for weakness. Negative for dizziness.  All other systems reviewed and are negative.  Blood pressure 164/127, pulse 110, temperature 97.9 F (36.6 C), temperature source Oral, resp. rate 23, height 6' (1.829 m), weight 245 lb (111.131 kg), SpO2 98.00%. Physical Exam  Nursing note and vitals reviewed. Constitutional: He is oriented to person, place, and time. He appears well-developed. He appears distressed.  Obese  HENT:  Head: Normocephalic and atraumatic.  Mouth/Throat: Oropharynx is clear and moist.  Eyes: EOM are normal. Pupils are equal, round, and reactive to light.  No scleral icterus.  Neck: Normal range of motion. Neck supple. JVD present.  Cardiovascular: Regular rhythm, S1 normal and S2 normal.  Tachycardia present.  Exam reveals gallop.   Pulses:      Radial pulses are 2+ on the right side, and 2+ on the left side.       Dorsalis pedis pulses are 2+ on the right side, and 2+ on the left side.  +Heave No carotid bruit  Respiratory: Effort normal. He has rales (right base).  GI: Soft. Bowel sounds are normal. He exhibits distension. There is no tenderness.  Musculoskeletal: He exhibits edema.  1+  Lymphadenopathy:    He has no cervical adenopathy.  Neurological: He is alert and oriented to person, place, and time. He exhibits normal muscle tone.  Skin: Skin is warm and dry.  Psychiatric: He has a normal mood and affect.    Assessment/Plan: Active Problems:   Acute congestive heart failure BNP elevated to 6303 with +JVD.  40 of IV lasix given and 6.25 of coreg.  Continue lasix 24m IV Q12hrs.  2D echo.  Could have htn/ischemic cardiomyopathy.  Says he does not drink much.  It sounds like his BP is poorly controlled.    NTG paste 1 inch.     Stable angina Cycle troponin. EKG with lateral TWI,  Tachy.   Treadmill cardiolite when stable unless there is a wall motion abnormality, then consider angiography.  ASA, Hold BB.      Accelerated hypertension  Add lisinopril 2.536mfor after load reduction.  .       Hypokalemia  Replace   Tachycardia  No BB for now. Check TSH.  Negative D-Dimer   Acute kidney injury  Monitor. SCr was 1.70 last November and improved to 1.36.  Now 1.57   HAGER, BRYAN, PA-C 03/03/2014, 4:01 PM     History and all data above reviewed.  Patient examined.  I agree with the findings as above.  The patient has new onset CHF.  Bedside echo with EF of about 15%.  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  No chest pain.  Three weeks of increased SOB.  The patient exam reveals COR:RRR  Loud S3,  Lungs: Clear  ,  Abd:  Positive bowel sounds, no rebound no guarding, Ext Moderate edema  .  All available labs, radiology testing, previous records reviewed. Agree with documented assessment and plan. Cardiomyopathy with acute systolic heart failure:  Hold on starting beta blocker for now (decompensated HF).  Diuresis, start low dose ACE (creat is up slightly) and use NTG paste with severe HTN.  Echo in the AM.  We had a long discussion about this diagnosis and treatment. (Bedside echo done.)    JaMinus Breeding4:28 PM  03/03/2014

## 2014-03-03 NOTE — ED Notes (Addendum)
Lab results was given to [facility] for I-stat troponin.

## 2014-03-03 NOTE — ED Notes (Signed)
Contacted lab about wait time on BNP  

## 2014-03-03 NOTE — ED Notes (Signed)
Pt refused wheelchair while ambulating from triage to D32.

## 2014-03-03 NOTE — ED Notes (Signed)
Patient is resting comfortably. 

## 2014-03-03 NOTE — Progress Notes (Signed)
Pt transferred to room 3E12 from the ED via stretcher. Pt alert and oriented x 4. Pt denies pain or concerns. SOB with rest. Assessment completed. Oriented pt to room and unit and placed call bell in reach. Will continue to monitor pt closely.  Juliane Lack, RN

## 2014-03-03 NOTE — Progress Notes (Signed)
UR completed Jaimon Bugaj K. Haely Leyland, RN, BSN, MSHL, CCM  03/03/2014 5:38 PM

## 2014-03-03 NOTE — ED Notes (Addendum)
Pt states that he feels like he is "hyperventilating". Pt states that he feels like his chest is throbbing when he is walking. Pt states that he only has chest pain when her is exerting himself. Pt states that this has been ongoing for several weeks. Pt states that he has not taken his BP medications because they make him feel tired.

## 2014-03-03 NOTE — H&P (Signed)
History and Physical  Derek Chapman ZOX:[id number] DOB: 1966/04/10 DOA: 03/03/2014   PCP: No PCP Per Patient   Chief Complaint: SOB, leg edema  HPI:  48 year old male with a history of hypertension and cocaine use presents to the emergency department with three-week history of progressive shortness of breath. The patient states that he has gained 12 pounds in the past month. He ran out of his amlodipine approximately 2-3 weeks ago. Prior to his amlodipine, the patient used to take atenolol, but he stated that this made him short of breath and more fatigued. He has been complaining of symptoms of dyspnea on exertion, leg edema, 6 pillow orthopnea, and PND. In addition, he has had some neck discomfort particularly when he is under exertion with associated shortness of breath and sour stomach feeling. He denies any fevers, chills, vomiting, abdominal pain, diarrhea, dysuria, hematuria, hematochezia, melena. Notably, the patient states that he last used cocaine 2-3 months ago. He quit smoking in 2006. He drinks alcohol socially.  In the emergency department, potassium is 3.3, serum creatinine 1.57. CBC was unremarkable. ProBNP was 6303. D-dimer was 0.39. Chest x-ray was without any pulmonary edema but showedvascular congestion. EKG was sinus rhythm with nonspecific T-wave changes. Urinalysis was negative for pyuria but did show glucose Assessment/Plan: Acute CHF -Continue furosemide 40 mg IV twice a day -Daily weights -Fluid restrict -Although the patient's chest x-ray did not show florid pulmonary edema his clinical history and exam with S3 gallop consistent with fluid overload -?component of R-heart failure -Echocardiogram Hypertension -Patient was given carvedilol in the emergency department which will be continued CKD stage III -Baseline creatinine 1.3-1.7 -Monitor while diuresing Chest discomfort -Concerning for angina especially with abnormal EKG -Cycle troponins Cocaine  abuse -Urine drug screen Asymmetric leg edema -Venous duplex right lower extremity r/o DVT Glycosuria -HbA1C      Past Medical History  Diagnosis Date  . Kidney stone   . Hypertension    Past Surgical History  Procedure Laterality Date  . No past surgeries     Social History:  reports that he has quit smoking. He quit smokeless tobacco use about 9 years ago. He reports that he drinks alcohol. He reports that he uses illicit drugs (Marijuana and Cocaine).   Family History  Problem Relation Age of Onset  . Other Neg Hx   . Diabetes Mother   . Hypertension Father   . Cancer Father      No Known Allergies    Prior to Admission medications   Medication Sig Start Date End Date Taking? Authorizing Provider  albuterol (PROVENTIL HFA;VENTOLIN HFA) 108 (90 BASE) MCG/ACT inhaler Inhale 1-2 puffs into the lungs every 6 (six) hours as needed for wheezing or shortness of breath.   Yes Historical Provider, MD  amLODipine (NORVASC) 10 MG tablet Take 1 tablet (10 mg total) by mouth daily. 05/14/13  Yes Ripudeep Jenna Luo, MD    Review of Systems:  Constitutional:  No weight loss, night sweats, Fevers, chills, fatigue.  Head&Eyes: No headache.  No vision loss.  No eye pain or scotoma ENT:  No Difficulty swallowing,Tooth/dental problems,Sore throat,  No ear ache, post nasal drip,  Cardio-vascular:  Patient complaint of chest discomfort, neck discomfort, orthopnea, PND, leg edema GI:  No  abdominal pain, nausea, vomiting, diarrhea, loss of appetite, hematochezia, melena, heartburn, indigestion, Resp:  No shortness of breath with exertion or at rest. No cough. No coughing up of blood .No wheezing.No chest wall deformity  Skin:  no rash or lesions.  GU:  no dysuria, change in color of urine, no urgency or frequency. No flank pain.  Musculoskeletal:  No joint pain or swelling. No decreased range of motion. No back pain.  Psych:  No change in mood or affect. No depression or  anxiety. Neurologic: No headache, no dysesthesia, no focal weakness, no vision loss. No syncope  Physical Exam: Filed Vitals:   03/03/14 1415 03/03/14 1430 03/03/14 1500 03/03/14 1529  BP:  161/118 164/127 164/127  Pulse: 108 108  110  Temp:      TempSrc:      Resp: 36 28 23   Height:      Weight:      SpO2: 97% 98%     General:  A&O x 3, NAD, nontoxic, pleasant/cooperative Head/Eye: No conjunctival hemorrhage, no icterus, [place]/AT, No nystagmus ENT:  No icterus,  No thrush, good dentition, no pharyngeal exudate Neck:  No masses, no lymphadenpathy,  CV:  RRR, no rub, +S3 Lung:  Bilateral crackles, left greater than right. No wheezing. Good air movement. Abdomen: soft/NT, +BS, nondistended, no peritoneal signs Ext: No cyanosis, No rashes, No petechiae, No lymphangitis, 2+ L>R LE edema Neuro: CNII-XII intact, strength 4/5 in bilateral upper and lower extremities, no dysmetria  Labs on Admission:  Basic Metabolic Panel:  Recent Labs Lab 03/03/14 1210  NA 139  K 3.3*  CL 100  CO2 25  GLUCOSE 144*  BUN 25*  CREATININE 1.57*  CALCIUM 9.2   Liver Function Tests: No results found for this basename: AST, ALT, ALKPHOS, BILITOT, PROT, ALBUMIN,  in the last 168 hours No results found for this basename: LIPASE, AMYLASE,  in the last 168 hours No results found for this basename: AMMONIA,  in the last 168 hours CBC:  Recent Labs Lab 03/03/14 1210  WBC 5.5  HGB 12.8*  HCT 37.1*  MCV 63.9*  PLT 216   Cardiac Enzymes: No results found for this basename: CKTOTAL, CKMB, CKMBINDEX, TROPONINI,  in the last 168 hours BNP: No components found with this basename: POCBNP,  CBG: No results found for this basename: GLUCAP,  in the last 168 hours  Radiological Exams on Admission: Dg Chest 2 View  03/03/2014   CLINICAL DATA:  Fever.  EXAM: CHEST  2 VIEW  COMPARISON:  05/13/2013.  FINDINGS: The heart is enlarged. There are no infiltrates or failure. There is no effusion or  pneumothorax. Bones are unremarkable. No change from priors.  IMPRESSION: Cardiomegaly, no active disease.   Electronically Signed   By: Davonna Belling M.D.   On: 03/03/2014 13:43    EKG: Independently reviewed. Sinus rhythm, nonspecific T wave changes    Time spent:60 minutes Code Status:   FULL Family Communication:   Family at bedside   Alanny Rivers, DO  [facility] Pager [phone]  If 7PM-7AM, please contact night-coverage [email or web address] Password [email or web address] 03/03/2014, 4:02 PM

## 2014-03-03 NOTE — ED Provider Notes (Signed)
CSN: [id number]     Arrival date & time 03/03/14  1132 History   First MD Initiated Contact with Patient 03/03/14 1211     Chief Complaint  Patient presents with  . Shortness of Breath     (Consider location/radiation/quality/duration/timing/severity/associated sxs/prior Treatment) HPI 48 year old male presents with progressive shortness of breath over the last 2 weeks. He states that he feels similar to this in November 2014 and was started on blood pressure medicine. You start on amlodipine and a beta blocker. He states the beta blocker was making him feel sick and so he stopped that. Patientbeen having worsening orthopnea and has been having to increase his pillows at night. He's noticed bilateral ankle swelling. When he walks the shortness of breath become significantly worse. He also developed chest throbbing and neck tightness while walking. Denies back pain. Denies current chest pain. He is short of breath as he walked from triage to the bed.  Past Medical History  Diagnosis Date  . Kidney stone   . Hypertension    Past Surgical History  Procedure Laterality Date  . No past surgeries     Family History  Problem Relation Age of Onset  . Other Neg Hx    History  Substance Use Topics  . Smoking status: Current Some Day Smoker  . Smokeless tobacco: Not on file  . Alcohol Use: No    Review of Systems  Constitutional: Positive for fatigue. Negative for fever.  Respiratory: Positive for shortness of breath.   Cardiovascular: Positive for chest pain and leg swelling.  Gastrointestinal: Negative for vomiting.  Musculoskeletal: Negative for back pain.  All other systems reviewed and are negative.     Allergies  Review of patient's allergies indicates no known allergies.  Home Medications   Prior to Admission medications   Medication Sig Start Date End Date Taking? Authorizing Provider  amLODipine (NORVASC) 10 MG tablet Take 1 tablet (10 mg total) by mouth daily.  05/14/13   Ripudeep Jenna Luo, MD  nitroGLYCERIN (NITROSTAT) 0.4 MG SL tablet Place 1 tablet (0.4 mg total) under the tongue every 5 (five) minutes as needed for chest pain. 05/14/13   Ripudeep Jenna Luo, MD   BP 191/127  Pulse 110  Temp(Src) 97.9 F (36.6 C) (Oral)  Resp 22  Ht 6' (1.829 m)  Wt 245 lb (111.131 kg)  BMI 33.22 kg/m2  SpO2 100% Physical Exam  Nursing note and vitals reviewed. Constitutional: He is oriented to person, place, and time. He appears well-developed and well-nourished. No distress.  HENT:  Head: Normocephalic and atraumatic.  Right Ear: External ear normal.  Left Ear: External ear normal.  Nose: Nose normal.  Eyes: Right eye exhibits no discharge. Left eye exhibits no discharge.  Neck: Neck supple.  Cardiovascular: Regular rhythm, normal heart sounds and intact distal pulses.  Tachycardia present.   Pulmonary/Chest: Breath sounds normal. He has no wheezes. He has no rales.  Increased WOB  Abdominal: Soft. There is no tenderness.  Musculoskeletal: He exhibits edema (bilateral lower extremity swelling. Nonpitting edema). He exhibits no tenderness.  Neurological: He is alert and oriented to person, place, and time.  Skin: Skin is warm and dry.    ED Course  Procedures (including critical care time) Labs Review Labs Reviewed  CBC - Abnormal; Notable for the following:    Hemoglobin 12.8 (*)    HCT 37.1 (*)    MCV 63.9 (*)    MCH 22.0 (*)    RDW 17.6 (*)  All other components within normal limits  BASIC METABOLIC PANEL - Abnormal; Notable for the following:    Potassium 3.3 (*)    Glucose, Bld 144 (*)    BUN 25 (*)    Creatinine, Ser 1.57 (*)    GFR calc non Af Amer 52 (*)    GFR calc Af Amer 60 (*)    All other components within normal limits  PRO B NATRIURETIC PEPTIDE - Abnormal; Notable for the following:    Pro B Natriuretic peptide (BNP) 6303.0 (*)    All other components within normal limits  D-DIMER, QUANTITATIVE  Rosezena Sensor, ED     Imaging Review Dg Chest 2 View  03/03/2014   CLINICAL DATA:  Fever.  EXAM: CHEST  2 VIEW  COMPARISON:  05/13/2013.  FINDINGS: The heart is enlarged. There are no infiltrates or failure. There is no effusion or pneumothorax. Bones are unremarkable. No change from priors.  IMPRESSION: Cardiomegaly, no active disease.   Electronically Signed   By: Davonna Belling M.D.   On: 03/03/2014 13:43     EKG Interpretation   Date/Time:  Tuesday March 03 2014 11:42:55 EDT Ventricular Rate:  110 PR Interval:  194 QRS Duration: 100 QT Interval:  324 QTC Calculation: 438 R Axis:   -26 Text Interpretation:  Sinus tachycardia Incomplete right bundle branch  block Anterior infarct , age undetermined T wave abnormality, consider  lateral ischemia Abnormal ECG No old tracing to compare Confirmed by  Sybol Morre  MD, Taurus Willis ([id number]) on 03/03/2014 12:11:44 PM      MDM   Final diagnoses:  Acute congestive heart failure, unspecified congestive heart failure type  Essential hypertension  EKG abnormalities    Patient has new onset heart failure, likely from uncontrolled hypertension. He is mildly tachycardic and hypertensive. He is having no chest pain or back pain due to be concerning for a dissection. Feels mostly from an overall poor hypertension control. Discussed with cardiology, given IV Lasix and he recommended starting on by mouth carvedilol. We'll need admission to the hospitalist. Has EKG changes with no old to compare to but is concerning for cardiac disease related to his hypertension.    Audree Camel, MD 03/03/14 1556

## 2014-03-03 NOTE — Care Management Note (Addendum)
    Page 1 of 1   03/06/2014     2:13:25 PM CARE MANAGEMENT NOTE 03/06/2014  Patient:  MAXINE, HUYNH   Account Number:  [id number]  Date Initiated:  03/03/2014  Documentation initiated by:  Donato Schultz  Subjective/Objective Assessment:   CHF     Action/Plan:   CM to follow for disposition needs   Anticipated DC Date:  03/06/2014   Anticipated DC Plan:  HOME/SELF CARE      DC Planning Services  CM consult      Choice offered to / List presented to:             Status of service:  Completed, signed off Medicare Important Message given?   (If response is "NO", the following Medicare IM given date fields will be blank) Date Medicare IM given:   Medicare IM given by:   Date Additional Medicare IM given:   Additional Medicare IM given by:    Discharge Disposition:  HOME/SELF CARE  Per UR Regulation:  Reviewed for med. necessity/level of care/duration of stay  If discussed at Long Length of Stay Meetings, dates discussed:    Comments:  03/06/14 1000 Oletta Cohn, RN, BSN, [place] [phone] Referred pt to [facility] for OP PT.  Pt instructed that office will call to set up appt and to call office if he has not heard from them by Tuesday.

## 2014-03-04 DIAGNOSIS — N179 Acute kidney failure, unspecified: Secondary | ICD-10-CM

## 2014-03-04 DIAGNOSIS — I059 Rheumatic mitral valve disease, unspecified: Secondary | ICD-10-CM

## 2014-03-04 LAB — BASIC METABOLIC PANEL
ANION GAP: 14 (ref 5–15)
BUN: 26 mg/dL — ABNORMAL HIGH (ref 6–23)
CHLORIDE: 102 meq/L (ref 96–112)
CO2: 27 mEq/L (ref 19–32)
Calcium: 8.8 mg/dL (ref 8.4–10.5)
Creatinine, Ser: 1.95 mg/dL — ABNORMAL HIGH (ref 0.50–1.35)
GFR, EST AFRICAN AMERICAN: 46 mL/min — AB (ref >90)
GFR, EST NON AFRICAN AMERICAN: 40 mL/min — AB (ref >90)
Glucose, Bld: 124 mg/dL — ABNORMAL HIGH (ref 70–99)
POTASSIUM: 3.1 meq/L — AB (ref 3.7–5.3)
SODIUM: 143 meq/L (ref 137–147)

## 2014-03-04 LAB — HEMOGLOBIN A1C
Hgb A1c MFr Bld: 6.9 % — ABNORMAL HIGH (ref <5.7)
MEAN PLASMA GLUCOSE: 151 mg/dL — AB (ref <117)

## 2014-03-04 LAB — TROPONIN I: Troponin I: <0.3 ng/mL (ref <0.30)

## 2014-03-04 MED ORDER — FUROSEMIDE 40 MG PO TABS
40.0000 mg | ORAL_TABLET | Freq: Every day | ORAL | Status: DC
  Administered 2014-03-04: 40 mg via ORAL
  Filled 2014-03-04 (×2): qty 1

## 2014-03-04 MED ORDER — ISOSORBIDE MONONITRATE ER 30 MG PO TB24
30.0000 mg | ORAL_TABLET | Freq: Every day | ORAL | Status: DC
  Administered 2014-03-04: 30 mg via ORAL
  Filled 2014-03-04 (×2): qty 1

## 2014-03-04 MED ORDER — HYDRALAZINE HCL 25 MG PO TABS
25.0000 mg | ORAL_TABLET | Freq: Three times a day (TID) | ORAL | Status: DC
  Administered 2014-03-04 – 2014-03-05 (×4): 25 mg via ORAL
  Filled 2014-03-04 (×7): qty 1

## 2014-03-04 MED ORDER — FUROSEMIDE 40 MG PO TABS
40.0000 mg | ORAL_TABLET | Freq: Two times a day (BID) | ORAL | Status: DC
  Filled 2014-03-04 (×3): qty 1

## 2014-03-04 MED ORDER — LIVING WELL WITH DIABETES BOOK
Freq: Once | Status: AC
  Administered 2014-03-04: 12:00:00
  Filled 2014-03-04: qty 1

## 2014-03-04 MED ORDER — CARVEDILOL 6.25 MG PO TABS
6.2500 mg | ORAL_TABLET | Freq: Two times a day (BID) | ORAL | Status: DC
  Administered 2014-03-04 – 2014-03-06 (×4): 6.25 mg via ORAL
  Filled 2014-03-04 (×6): qty 1

## 2014-03-04 MED ORDER — POTASSIUM CHLORIDE CRYS ER 20 MEQ PO TBCR
40.0000 meq | EXTENDED_RELEASE_TABLET | Freq: Two times a day (BID) | ORAL | Status: DC
  Administered 2014-03-04 – 2014-03-06 (×5): 40 meq via ORAL
  Filled 2014-03-04 (×6): qty 2

## 2014-03-04 NOTE — Progress Notes (Signed)
Spoke with patient about new diabetes diagnosis. Patient reports that he was told over a year ago that he had Prediabetes and his mother and sister have diabetes. Discussed A1C results (6.9% on 03/03/14) and explained what an A1C is, basic pathophysiology of DM Type 2, basic home care, importance of checking CBGs and maintaining good CBG control to prevent long-term and short-term complications. Reviewed Living Well with Diabetes booklet with the patient. Reviewed signs and symptoms of hyperglycemia and hypoglycemia along with treatment for both. Discussed impact of nutrition, exercise, stress, and sickness on glycemic control. Discussed carbohydrates, daily carbohydrate goals, and portion size. Informed patient that according to progress note by Dr. Jomarie Longs, he would be discharged on Metformin. Discussed Metformin and potential GI upset side effects. Patient requested outpatient diabetes education. So will place consult for [facility] for outpatient diabetes education.  RNs to provide ongoing basic DM education at bedside with this patient and engage patient to actively check blood glucose. Have ordered educational booklet, DM videos, and RD consult.  Thanks, Orlando Penner, RN, MSN, CCRN Diabetes Coordinator Inpatient Diabetes Program [phone] (Team Pager) [phone] ([facility] office) [phone] [facility] [place] office)

## 2014-03-04 NOTE — Progress Notes (Signed)
Inpatient Diabetes Program Recommendations  AACE/ADA: New Consensus Statement on Inpatient Glycemic Control (2013)  Target Ranges:  Prepandial:   less than 140 mg/dL      Peak postprandial:   less than 180 mg/dL (1-2 hours)      Critically ill patients:  140 - 180 mg/dL   Results for KO, BARDON (MRN [id number]) as of 03/04/2014 11:28  Ref. Range 03/03/2014 12:10 03/03/2014 18:00 03/04/2014 05:26  Hemoglobin A1C Latest Range: <5.7 %  6.9 (H)   Glucose Latest Range: 70-99 mg/dL 409 (H)  811 (H)   Diabetes history: No Outpatient Diabetes medications: NA Current orders for Inpatient glycemic control: None  Inpatient Diabetes Program Recommendations Correction (SSI): Noted new DM dx and plan to discharge on Metformin; please consider ordering CBGs with Novolog correction while inpatient.  Thanks, Orlando Penner, RN, MSN, CCRN Diabetes Coordinator Inpatient Diabetes Program [phone] (Team Pager) [phone] (AP office) [phone] [facility] office)

## 2014-03-04 NOTE — Progress Notes (Signed)
  Echocardiogram 2D Echocardiogram has been performed.  Derek Chapman 03/04/2014, 12:00 PM

## 2014-03-04 NOTE — Progress Notes (Signed)
SUBJECTIVE:  No pain.  SOB is 75% improved   PHYSICAL EXAM Filed Vitals:   03/03/14 1833 03/03/14 2125 03/03/14 2236 03/04/14 0452  BP:  143/114 139/102 146/88  Pulse:  102  86  Temp:  97.9 F (36.6 C)  98.4 F (36.9 C)  TempSrc:  Oral  Oral  Resp:  18  18  Height:      Weight: 248 lb (112.492 kg)   240 lb 11.9 oz (109.2 kg)  SpO2:  100%  100%   General:  No distress Lungs:  Clear Heart:  RRR Abdomen:  Positive bowel sounds, no rebound no guarding Extremities:  NO edema.  JVD:  To jaw.  LABS: Lab Results  Component Value Date   TROPONINI <0.30 03/04/2014   Results for orders placed during the hospital encounter of 03/03/14 (from the past 24 hour(s))  CBC     Status: Abnormal   Collection Time    03/03/14 12:10 PM      Result Value Ref Range   WBC 5.5  4.0 - 10.5 K/uL   RBC 5.81  4.22 - 5.81 MIL/uL   Hemoglobin 12.8 (*) 13.0 - 17.0 g/dL   HCT 40.9 (*) 81.1 - 91.4 %   MCV 63.9 (*) 78.0 - 100.0 fL   MCH 22.0 (*) 26.0 - 34.0 pg   MCHC 34.5  30.0 - 36.0 g/dL   RDW 78.2 (*) 95.6 - 21.3 %   Platelets 216  150 - 400 K/uL  BASIC METABOLIC PANEL     Status: Abnormal   Collection Time    03/03/14 12:10 PM      Result Value Ref Range   Sodium 139  137 - 147 mEq/L   Potassium 3.3 (*) 3.7 - 5.3 mEq/L   Chloride 100  96 - 112 mEq/L   CO2 25  19 - 32 mEq/L   Glucose, Bld 144 (*) 70 - 99 mg/dL   BUN 25 (*) 6 - 23 mg/dL   Creatinine, Ser 0.86 (*) 0.50 - 1.35 mg/dL   Calcium 9.2  8.4 - 57.8 mg/dL   GFR calc non Af Amer 52 (*) >90 mL/min   GFR calc Af Amer 60 (*) >90 mL/min   Anion gap 14  5 - 15  PRO B NATRIURETIC PEPTIDE     Status: Abnormal   Collection Time    03/03/14 12:10 PM      Result Value Ref Range   Pro B Natriuretic peptide (BNP) 6303.0 (*) 0 - 125 pg/mL  I-STAT TROPOININ, ED     Status: None   Collection Time    03/03/14 12:18 PM      Result Value Ref Range   Troponin i, poc 0.07  0.00 - 0.08 ng/mL   Comment 3           D-DIMER, QUANTITATIVE      Status: None   Collection Time    03/03/14 12:35 PM      Result Value Ref Range   D-Dimer, Quant 0.39  0.00 - 0.48 ug/mL-FEU  TSH     Status: None   Collection Time    03/03/14  6:00 PM      Result Value Ref Range   TSH 1.880  0.350 - 4.500 uIU/mL  TROPONIN I     Status: None   Collection Time    03/03/14  6:00 PM      Result Value Ref Range   Troponin I <0.30  <0.30 ng/mL  HEMOGLOBIN A1C     Status: Abnormal   Collection Time    03/03/14  6:00 PM      Result Value Ref Range   Hemoglobin A1C 6.9 (*) <5.7 %   Mean Plasma Glucose 151 (*) <117 mg/dL  URINE RAPID DRUG SCREEN (HOSP PERFORMED)     Status: None   Collection Time    03/03/14  6:27 PM      Result Value Ref Range   Opiates NONE DETECTED  NONE DETECTED   Cocaine NONE DETECTED  NONE DETECTED   Benzodiazepines NONE DETECTED  NONE DETECTED   Amphetamines NONE DETECTED  NONE DETECTED   Tetrahydrocannabinol NONE DETECTED  NONE DETECTED   Barbiturates NONE DETECTED  NONE DETECTED  TROPONIN I     Status: None   Collection Time    03/03/14 10:27 PM      Result Value Ref Range   Troponin I <0.30  <0.30 ng/mL  BASIC METABOLIC PANEL     Status: Abnormal   Collection Time    03/04/14  5:26 AM      Result Value Ref Range   Sodium 143  137 - 147 mEq/L   Potassium 3.1 (*) 3.7 - 5.3 mEq/L   Chloride 102  96 - 112 mEq/L   CO2 27  19 - 32 mEq/L   Glucose, Bld 124 (*) 70 - 99 mg/dL   BUN 26 (*) 6 - 23 mg/dL   Creatinine, Ser 1.61 (*) 0.50 - 1.35 mg/dL   Calcium 8.8  8.4 - 09.6 mg/dL   GFR calc non Af Amer 40 (*) >90 mL/min   GFR calc Af Amer 46 (*) >90 mL/min   Anion gap 14  5 - 15  TROPONIN I     Status: None   Collection Time    03/04/14  5:26 AM      Result Value Ref Range   Troponin I <0.30  <0.30 ng/mL    Intake/Output Summary (Last 24 hours) at 03/04/14 0818 Last data filed at 03/04/14 0454  Gross per 24 hour  Intake    600 ml  Output   3270 ml  Net  -2670 ml     ASSESSMENT AND PLAN:  ACUTE SYSTOLIC HEART  FAILURE:  I suspect related to HTN.  Good diuresis and he feels better.  I will reduce the Lasix to PO today and add beta blocker.  Continue with nitrates and start low dose hydralazine.  Echo pending  CKD:  I suspect also HTN CKD.  Avoid ACE for now.  I will stop this.    HTN:  This is being managed in the context of treating his CHF  SLEEP APNEA:   He will need referral for treatment of this at discharge.   DM:  A1C is 6.9.  Per primary team.   Rollene Rotunda 03/04/2014 8:18 AM

## 2014-03-04 NOTE — Progress Notes (Signed)
[facility] PROGRESS NOTE  Derek Chapman JXB:[id number] DOB: Oct 13, 1965 DOA: 03/03/2014 PCP: No PCP Per Patient  Assessment/Plan: 1. Acute CHF -lasix changed to PO, due to bump in creatinine and clinical improvement -ECHO today -diet education  2. Cocaine abuse -counseled  3. HTN -BP improved with diruesis -continue coreg and hydralazine  4. CKD 2 -creatinine bumped with diuresis -changed lasix to PO  5. Hyperglycemia -Hbaic 6.9, new DM -start metformin at DC  Code Status: Full Code Family Communication: none at bedside Disposition Plan: home in 1-2days   Consultants:  Cards  HPI/Subjective: breathing much improved  Objective: Filed Vitals:   03/04/14 0916  BP: 136/85  Pulse: 84  Temp: 98 F (36.7 C)  Resp: 20    Intake/Output Summary (Last 24 hours) at 03/04/14 0939 Last data filed at 03/04/14 0847  Gross per 24 hour  Intake    840 ml  Output   3270 ml  Net  -2430 ml   Filed Weights   03/03/14 1146 03/03/14 1833 03/04/14 0452  Weight: 111.131 kg (245 lb) 112.492 kg (248 lb) 109.2 kg (240 lb 11.9 oz)    Exam:   General:  AAOx3, no distress  Cardiovascular: S1S2/RRR  Respiratory: CTsoft, NT, BS present  Musculoskeletal: trace edema  Data Reviewed: Basic Metabolic Panel:  Recent Labs Lab 03/03/14 1210 03/04/14 0526  NA 139 143  K 3.3* 3.1*  CL 100 102  CO2 25 27  GLUCOSE 144* 124*  BUN 25* 26*  CREATININE 1.57* 1.95*  CALCIUM 9.2 8.8   Liver Function Tests: No results found for this basename: AST, ALT, ALKPHOS, BILITOT, PROT, ALBUMIN,  in the last 168 hours No results found for this basename: LIPASE, AMYLASE,  in the last 168 hours No results found for this basename: AMMONIA,  in the last 168 hours CBC:  Recent Labs Lab 03/03/14 1210  WBC 5.5  HGB 12.8*  HCT 37.1*  MCV 63.9*  PLT 216   Cardiac Enzymes:  Recent Labs Lab 03/03/14 1800 03/03/14 2227 03/04/14 0526  TROPONINI <0.30 <0.30 <0.30   BNP (last 3  results)  Recent Labs  05/13/13 2347 03/03/14 1210  PROBNP 1513.0* 6303.0*   CBG: No results found for this basename: GLUCAP,  in the last 168 hours  No results found for this or any previous visit (from the past 240 hour(s)).   Studies: Dg Chest 2 View  03/03/2014   CLINICAL DATA:  Fever.  EXAM: CHEST  2 VIEW  COMPARISON:  05/13/2013.  FINDINGS: The heart is enlarged. There are no infiltrates or failure. There is no effusion or pneumothorax. Bones are unremarkable. No change from priors.  IMPRESSION: Cardiomegaly, no active disease.   Electronically Signed   By: Davonna Belling M.D.   On: 03/03/2014 13:43    Scheduled Meds: . aspirin EC  325 mg Oral Daily  . carvedilol  6.25 mg Oral BID WC  . enoxaparin (LOVENOX) injection  40 mg Subcutaneous Q24H  . furosemide  40 mg Oral BID  . hydrALAZINE  25 mg Oral 3 times per day  . isosorbide mononitrate  30 mg Oral Daily  . potassium chloride  40 mEq Oral BID  . sodium chloride  3 mL Intravenous Q12H   Continuous Infusions:  Antibiotics Given (last 72 hours)   None      Principal Problem:   Acute congestive heart failure Active Problems:   Accelerated hypertension   Hypokalemia   Tachycardia   Stable angina   Acute kidney  injury   Acute CHF   CKD (chronic kidney disease) stage 3, GFR 30-59 ml/min   Glycosuria    Time spent:    [facility]  [facility] Pager [phone]. If 7PM-7AM, please contact night-coverage at [email or web address], password [facility] 03/04/2014, 9:39 AM  LOS: 1 day

## 2014-03-04 NOTE — Progress Notes (Signed)
Pt had 9 beats of VTach, asymptomatic, denies chest pain, denies nausea& vomiting, not in respiratory distress. Donnamarie Poag was notified. Will continue to monitor pt

## 2014-03-04 NOTE — Progress Notes (Signed)
Nutrition Education Note  RD consulted for nutrition education regarding new onset CHF and regarding diabetes.   RD provided "Low Sodium Nutrition Therapy" handout from the Academy of [employer] and Dietetics. Reviewed patient's dietary recall. Provided examples on ways to decrease sodium intake in diet. Discouraged intake of processed foods and use of salt shaker. Encouraged fresh fruits and vegetables as well as whole grain sources of carbohydrates to maximize fiber intake.   RD discussed why it is important for patient to adhere to diet recommendations, and emphasized the role of fluids, foods to avoid, and importance of weighing self daily. Teach back method used.  Expect good compliance.   Lab Results  Component Value Date   HGBA1C 6.9* 03/03/2014    RD provided "Carbohydrate Counting for People with Diabetes" handout from the Academy of Nutrition and Dietetics. Discussed different food groups and their effects on blood sugar, emphasizing carbohydrate-containing foods. Provided list of carbohydrates and recommended serving sizes of common foods.  Discussed importance of controlled and consistent carbohydrate intake throughout the day. Provided examples of ways to balance meals/snacks and encouraged intake of high-fiber, whole grain complex carbohydrates. Teach back method used.  Expect good compliance. Pt seems very motivated to make changes.   Body mass index is 32.64 kg/(m^2). Pt meets criteria for Obesity based on current BMI.  Current diet order is Heart Healthy/Carb Modified, patient is consuming approximately 100% of meals at this time. Labs and medications reviewed. No further nutrition interventions warranted at this time. RD contact information provided. If additional nutrition issues arise, please re-consult RD.  Ian Malkin RD, LDN Inpatient Clinical Dietitian Pager: [phone] After Hours Pager: [phone]

## 2014-03-05 DIAGNOSIS — R609 Edema, unspecified: Secondary | ICD-10-CM

## 2014-03-05 LAB — BASIC METABOLIC PANEL
Anion gap: 15 (ref 5–15)
BUN: 28 mg/dL — ABNORMAL HIGH (ref 6–23)
CO2: 25 mEq/L (ref 19–32)
Calcium: 8.5 mg/dL (ref 8.4–10.5)
Chloride: 100 mEq/L (ref 96–112)
Creatinine, Ser: 1.71 mg/dL — ABNORMAL HIGH (ref 0.50–1.35)
GFR calc Af Amer: 54 mL/min — ABNORMAL LOW (ref >90)
GFR, EST NON AFRICAN AMERICAN: 47 mL/min — AB (ref >90)
GLUCOSE: 106 mg/dL — AB (ref 70–99)
Potassium: 3.6 mEq/L — ABNORMAL LOW (ref 3.7–5.3)
Sodium: 140 mEq/L (ref 137–147)

## 2014-03-05 MED ORDER — ISOSORB DINITRATE-HYDRALAZINE 20-37.5 MG PO TABS: 1.0000 | ORAL_TABLET | Freq: Two times a day (BID) | ORAL | Status: DC

## 2014-03-05 MED ORDER — ISOSORB DINITRATE-HYDRALAZINE 20-37.5 MG PO TABS
1.0000 | ORAL_TABLET | Freq: Three times a day (TID) | ORAL | Status: DC
  Administered 2014-03-05 – 2014-03-06 (×4): 1 via ORAL
  Filled 2014-03-05 (×6): qty 1

## 2014-03-05 MED ORDER — MENTHOL 3 MG MT LOZG
1.0000 | LOZENGE | OROMUCOSAL | Status: DC | PRN
  Filled 2014-03-05: qty 9

## 2014-03-05 MED ORDER — FUROSEMIDE 80 MG PO TABS
80.0000 mg | ORAL_TABLET | Freq: Every day | ORAL | Status: DC
  Administered 2014-03-06: 80 mg via ORAL
  Filled 2014-03-05: qty 1

## 2014-03-05 NOTE — Progress Notes (Signed)
    SUBJECTIVE:  No pain.  SOB is 75% improved   PHYSICAL EXAM Filed Vitals:   03/04/14 1517 03/04/14 1843 03/04/14 2120 03/05/14 0533  BP: 132/78 140/88 124/91 150/106  Pulse:  92 90 83  Temp:   97.9 F (36.6 C) 97.6 F (36.4 C)  TempSrc:   Oral Oral  Resp:   18 20  Height:      Weight:    243 lb 12.8 oz (110.587 kg)  SpO2:   97% 98%   General:  No distress Lungs:  Clear Heart:  RRR Abdomen:  Positive bowel sounds, no rebound no guarding Extremities:  NO edema.  JVD:  To jaw.  LABS: Lab Results  Component Value Date   TROPONINI <0.30 03/04/2014   No results found for this or any previous visit (from the past 24 hour(s)).  Intake/Output Summary (Last 24 hours) at 03/05/14 1610 Last data filed at 03/04/14 2143  Gross per 24 hour  Intake    780 ml  Output    975 ml  Net   -195 ml     ASSESSMENT AND PLAN:  ACUTE SYSTOLIC HEART FAILURE:   I will increase the diuretic again today and change to BiDil.    I think he needs one more day in the hospital.   CKD:  I suspect also HTN CKD.   Creat is slightly better.    HTN:  This is being managed in the context of treating his CHF  SLEEP APNEA:   He will need referral for treatment of this at discharge.   DM:  A1C is 6.9.   To start metformin.   Fayrene Fearing [facility] 03/05/2014 8:07 AM

## 2014-03-05 NOTE — Evaluation (Signed)
Physical Therapy Evaluation and Discharge Patient Details Name: Derek Chapman MRN: [id number] DOB: Nov 24, 1965 Today's Date: 03/05/2014   History of Present Illness  Pt is a 48 y/o male with a history of HTN and cocaine use presents to the emergency department with three-week history of progressive SOB. The patient states that he has gained 12 pounds in the past month. He ran out of his amlodipine approximately 2-3 weeks ago. He has been complaining of symptoms of dyspnea on exertion, leg edema, 6 pillow orthopnea, and PND.  Notably, the patient states that he last used cocaine 2-3 months ago.  Clinical Impression  Patient evaluated by Physical Therapy with no further acute PT needs identified. All education has been completed and the patient has no further questions. At the time of PT eval pt independent with functional mobility, however tolerance for that functional activity was low. Feel that pt is appropriate for outpatient PT at d/c for continued conditioning. Recommended short walks around the room x4/day, and longer walks in the hallway with nursing staff to tolerance 2x/day. See below for any follow-up Physial Therapy or equipment needs. PT is signing off. Thank you for this referral.     Follow Up Recommendations Outpatient PT - for continued endurance training and high level balance training    Equipment Recommendations  None recommended by PT    Recommendations for Other Services       Precautions / Restrictions Precautions Precautions: Fall Restrictions Weight Bearing Restrictions: No      Mobility  Bed Mobility               General bed mobility comments: Pt sitting in recliner upon arrival.  Transfers Overall transfer level: Independent Equipment used: None             General transfer comment: Pt does not require assist to power-up to full standing. Pt did not demonstrate any unsteadiness.  Ambulation/Gait Ambulation/Gait assistance:  Independent Ambulation Distance (Feet): 200 Feet Assistive device: None Gait Pattern/deviations: WFL(Within Functional Limits) Gait velocity: WFL Gait velocity interpretation: at or above normal speed for age/gender General Gait Details: Pt ambulating at his normal pace, and with significant SOB after 100'. Pt cued for standing rest break and for pursed-lip breathing. Pt cued for slower pace back to room, and pt only mildly SOB when arrived back to room.  O2 sats in high 90's throughout gait training.   Stairs            Wheelchair Mobility    Modified Rankin (Stroke Patients Only)       Balance Overall balance assessment: Modified Independent                                           Pertinent Vitals/Pain Pain Assessment: No/denies pain    Home Living Family/patient expects to be discharged to:: Private residence Living Arrangements: Alone Available Help at Discharge: Family;Available PRN/intermittently Type of Home: House Home Access: Level entry     Home Layout: Two level;1/2 bath on main level;Bed/bath upstairs Home Equipment: None      Prior Function Level of Independence: Independent         Comments: Driving, still working     Hand Dominance   Dominant Hand: Right    Extremity/Trunk Assessment   Upper Extremity Assessment: Overall WFL for tasks assessed           Lower  Extremity Assessment: Overall WFL for tasks assessed      Cervical / Trunk Assessment: Normal  Communication   Communication: No difficulties  Cognition Arousal/Alertness: Awake/alert Behavior During Therapy: WFL for tasks assessed/performed Overall Cognitive Status: Within Functional Limits for tasks assessed                      General Comments      Exercises        Assessment/Plan    PT Assessment Patent does not need any further PT services  PT Diagnosis     PT Problem List    PT Treatment Interventions     PT Goals (Current  goals can be found in the Care Plan section) Acute Rehab PT Goals Patient Stated Goal: To return to his previous level of endurance PT Goal Formulation: No goals set, d/c therapy    Frequency     Barriers to discharge        Co-evaluation               End of Session Equipment Utilized During Treatment: Gait belt Activity Tolerance: Patient tolerated treatment well Patient left: in chair;with call bell/phone within reach Nurse Communication: Mobility status         Time: 0454-0981 PT Time Calculation (min): 18 min   Charges:   PT Evaluation $Initial PT Evaluation Tier I: 1 Procedure PT Treatments $Gait Training: 8-22 mins   PT G CodesRuthann Cancer 03/05/2014, 1:56 PM  Ruthann Cancer, PT, DPT Acute Rehabilitation Services Pager: [phone]

## 2014-03-05 NOTE — Plan of Care (Signed)
Problem: Phase I Progression Outcomes Goal: EF % per last Echo/documented,Core Reminder form on chart Outcome: Completed/Met Date Met:  03/05/14 EF performed on 03/04/2014 EF result is 15%

## 2014-03-05 NOTE — Progress Notes (Signed)
Pt states "I have not pee.much today."  Noted U0P at 1227.  Informed Hinda Glatter, PA & also mentioned lasix was d/c this am .  PA,  instructed ok for pt not to have lasix at this time.  Pt made ware Will continue to monitor.  Amanda Pea, Charity fundraiser.

## 2014-03-05 NOTE — Progress Notes (Signed)
Nutrition Education Note  RD consulted for diet education. RD provided bot low sodium and diabetic diet education 03/04/14.  Pt states he reviewed handouts last night with his daughter and they plan to clean out his refrigerator and go grocery shopping after he is discharged. Pt states he was eating out 90% of the time PTA but, he likes to cook and plans to do more cooking from now on.  RD re-emphasized both diets and provided tips for balancing meals. Provided tips for eating out. Provided pt with "5-Day 1800-Calorie Sample menus" from the Academy of nutrition and Dietetics.  Teach back method used. Expect very goof compliance.  No further nutrition interventions warranted at this time. RD contact information provided. Encouraged pt to call with any additional questions.  Ian Malkin RD, LDN Inpatient Clinical Dietitian Pager: [phone] After Hours Pager: [phone]

## 2014-03-05 NOTE — Progress Notes (Signed)
[facility] PROGRESS NOTE  Derek Chapman FAO:[id number] DOB: November 02, 1965 DOA: 03/03/2014 PCP: No PCP Per Patient  Assessment/Plan: 1. Acute Systolic CHF -EF of 15% based on ECHO -Per Cards high suspicion of NICM -Plan for Stress test as outpatient -lasix changed to PO, due to bump in creatinine and clinical improvement -diet education today -No ACE due to AKI  2. Cocaine abuse -counseled  3. HTN -BP improved with diruesis -continue coreg and bidil added today  4. AKI on CKD 2 -creatinine bumped with diuresis -changed lasix to PO -NO ACE for now  5. Hyperglycemia -Hbaic 6.9, new DM -start metformin at DC  Code Status: Full Code Family Communication: none at bedside Disposition Plan: home tomorrow if stable   Consultants:  Cards  HPI/Subjective: breathing much improved  Objective: Filed Vitals:   03/05/14 0533  BP: 150/106  Pulse: 83  Temp: 97.6 F (36.4 C)  Resp: 20    Intake/Output Summary (Last 24 hours) at 03/05/14 1125 Last data filed at 03/05/14 0827  Gross per 24 hour  Intake    900 ml  Output    975 ml  Net    -75 ml   Filed Weights   03/03/14 1833 03/04/14 0452 03/05/14 0533  Weight: 112.492 kg (248 lb) 109.2 kg (240 lb 11.9 oz) 110.587 kg (243 lb 12.8 oz)    Exam:   General:  AAOx3, no distress  Cardiovascular: S1S2/RRR  Respiratory: CTsoft, NT, BS present  Musculoskeletal: trace edema  Data Reviewed: Basic Metabolic Panel:  Recent Labs Lab 03/03/14 1210 03/04/14 0526 03/05/14 0641  NA 139 143 140  K 3.3* 3.1* 3.6*  CL 100 102 100  CO2 GLUCOSE 144* 124* 106*  BUN 25* 26* 28*  CREATININE 1.57* 1.95* 1.71*  CALCIUM 9.2 8.8 8.5   Liver Function Tests: No results found for this basename: AST, ALT, ALKPHOS, BILITOT, PROT, ALBUMIN,  in the last 168 hours No results found for this basename: LIPASE, AMYLASE,  in the last 168 hours No results found for this basename: AMMONIA,  in the last 168  hours CBC:  Recent Labs Lab 03/03/14 1210  WBC 5.5  HGB 12.8*  HCT 37.1*  MCV 63.9*  PLT 216   Cardiac Enzymes:  Recent Labs Lab 03/03/14 1800 03/03/14 2227 03/04/14 0526  TROPONINI <0.30 <0.30 <0.30   BNP (last 3 results)  Recent Labs  05/13/13 2347 03/03/14 1210  PROBNP 1513.0* 6303.0*   CBG: No results found for this basename: GLUCAP,  in the last 168 hours  No results found for this or any previous visit (from the past 240 hour(s)).   Studies: Dg Chest 2 View  03/03/2014   CLINICAL DATA:  Fever.  EXAM: CHEST  2 VIEW  COMPARISON:  05/13/2013.  FINDINGS: The heart is enlarged. There are no infiltrates or failure. There is no effusion or pneumothorax. Bones are unremarkable. No change from priors.  IMPRESSION: Cardiomegaly, no active disease.   Electronically Signed   By: Davonna Belling M.D.   On: 03/03/2014 13:43    Scheduled Meds: . aspirin EC  325 mg Oral Daily  . carvedilol  6.25 mg Oral BID WC  . enoxaparin (LOVENOX) injection  40 mg Subcutaneous Q24H  . [START ON 03/06/2014] furosemide  80 mg Oral Daily  . isosorbide-hydrALAZINE  1 tablet Oral TID  . potassium chloride  40 mEq Oral BID  . sodium chloride  3 mL Intravenous Q12H   Continuous Infusions:  Antibiotics Given (last  72 hours)   None      Principal Problem:   Acute congestive heart failure Active Problems:   Accelerated hypertension   Hypokalemia   Tachycardia   Stable angina   Acute kidney injury   Acute CHF   CKD (chronic kidney disease) stage 3, GFR 30-59 ml/min   Glycosuria    Time spent:    [facility]  [facility] Pager [phone]. If 7PM-7AM, please contact night-coverage at [email or web address], password [facility] 03/05/2014, 11:25 AM  LOS: 2 days

## 2014-03-05 NOTE — Progress Notes (Signed)
*  PRELIMINARY RESULTS* Vascular Ultrasound Right lower extremity venous duplex has been completed.  Preliminary findings: no evidence of DVT. Right baker's cyst noted.   Farrel Demark, RDMS, RVT  03/05/2014, 12:33 PM

## 2014-03-06 DIAGNOSIS — I5021 Acute systolic (congestive) heart failure: Secondary | ICD-10-CM

## 2014-03-06 DIAGNOSIS — E139 Other specified diabetes mellitus without complications: Secondary | ICD-10-CM

## 2014-03-06 DIAGNOSIS — E119 Type 2 diabetes mellitus without complications: Secondary | ICD-10-CM | POA: Diagnosis present

## 2014-03-06 LAB — BASIC METABOLIC PANEL
Anion gap: 17 — ABNORMAL HIGH (ref 5–15)
BUN: 25 mg/dL — ABNORMAL HIGH (ref 6–23)
CO2: 22 mEq/L (ref 19–32)
Calcium: 8.6 mg/dL (ref 8.4–10.5)
Chloride: 102 mEq/L (ref 96–112)
Creatinine, Ser: 1.61 mg/dL — ABNORMAL HIGH (ref 0.50–1.35)
GFR calc Af Amer: 58 mL/min — ABNORMAL LOW (ref >90)
GFR calc non Af Amer: 50 mL/min — ABNORMAL LOW (ref >90)
GLUCOSE: 111 mg/dL — AB (ref 70–99)
Potassium: 3.9 mEq/L (ref 3.7–5.3)
Sodium: 141 mEq/L (ref 137–147)

## 2014-03-06 MED ORDER — CARVEDILOL 12.5 MG PO TABS
12.5000 mg | ORAL_TABLET | Freq: Two times a day (BID) | ORAL | Status: DC
  Filled 2014-03-06 (×2): qty 1

## 2014-03-06 MED ORDER — ISOSORB DINITRATE-HYDRALAZINE 20-37.5 MG PO TABS: 1.0000 | ORAL_TABLET | Freq: Three times a day (TID) | ORAL | Status: DC

## 2014-03-06 MED ORDER — POTASSIUM CHLORIDE CRYS ER 20 MEQ PO TBCR: 40.0000 meq | EXTENDED_RELEASE_TABLET | Freq: Two times a day (BID) | ORAL | Status: DC

## 2014-03-06 MED ORDER — FUROSEMIDE 80 MG PO TABS: 80.0000 mg | ORAL_TABLET | Freq: Every day | ORAL | Status: DC

## 2014-03-06 MED ORDER — CARVEDILOL 12.5 MG PO TABS: 12.5000 mg | ORAL_TABLET | Freq: Two times a day (BID) | ORAL | Status: DC

## 2014-03-06 MED ORDER — METFORMIN HCL 500 MG PO TABS: 500.0000 mg | ORAL_TABLET | Freq: Every day | ORAL | Status: DC

## 2014-03-06 NOTE — Progress Notes (Signed)
Heart Failure Navigator Consult Note  Presentation: Derek Chapman is a 48 year old male with a history of hypertension and cocaine use presents to the emergency department with three-week history of progressive shortness of breath. The patient states that he has gained 12 pounds in the past month. He ran out of his amlodipine approximately 2-3 weeks ago. Prior to his amlodipine, the patient used to take atenolol, but he stated that this made him short of breath and more fatigued. He has been complaining of symptoms of dyspnea on exertion, leg edema, 6 pillow orthopnea, and PND. In addition, he has had some neck discomfort particularly when he is under exertion with associated shortness of breath and sour stomach feeling. He denies any fevers, chills, vomiting, abdominal pain, diarrhea, dysuria, hematuria, hematochezia, melena. Notably, the patient states that he last used cocaine 2-3 months ago. He quit smoking in 2006. He drinks alcohol socially.  In the emergency department, potassium is 3.3, serum creatinine 1.57. CBC was unremarkable. ProBNP was 6303. D-dimer was 0.39. Chest x-ray was without any pulmonary edema but showedvascular congestion. EKG was sinus rhythm with nonspecific T-wave changes. Urinalysis was negative for pyuria but did show glucose.   Past Medical History  Diagnosis Date  . Kidney stone   . Hypertension     History   Social History  . Marital Status: Single    Spouse Name: N/A    Number of Children: N/A  . Years of Education: N/A   Social History Main Topics  . Smoking status: Former Games developer  . Smokeless tobacco: Former Neurosurgeon    Quit date: 07/10/2004     Comment: Smoked from 2003-2006  . Alcohol Use: Yes     Comment: 2 per month  . Drug Use: Yes    Special: Marijuana, Cocaine  . Sexual Activity: None   Other Topics Concern  . None   Social History Narrative  . None    ECHO:Study Conclusions--03/04/14  - Left ventricle: The cavity size was mildly dilated.  Wall thickness was increased in a pattern of mild LVH. The estimated ejection fraction was 15%. Diffuse hypokinesis. Features are consistent with a pseudonormal left ventricular filling pattern, with concomitant abnormal relaxation and increased filling pressure (grade 2 diastolic dysfunction). E/medial e&' > 15 suggests LV end diastolic pressure at least 20 mmHg. - Aortic valve: There was no stenosis. - Mitral valve: There was mild regurgitation. - Left atrium: The atrium was mildly to moderately dilated. - Right ventricle: The cavity size was mildly dilated. Systolic function was moderately reduced. - Right atrium: The atrium was mildly dilated. - Pulmonary arteries: No complete TR doppler jet so unable to estimate PA systolic pressure. - Systemic veins: IVC measured 2.4 with < 50% respirophasic variation, suggesting RA pressure 15 mmHg. - Pericardium, extracardiac: A trivial pericardial effusion was identified posterior to the heart.  Impressions:  - Mildly dilated LV with mild LV hypertrophy. EF 15%. Moderate diastolic dysfunction with evidence for elevated LV filling pressure. Mildly dilated RV with moderately decreased systolic function. Mild MR. Dilated IVC suggests elevated RV filling pressure.  Transthoracic echocardiography. M-mode, complete 2D, spectral Doppler, and color Doppler. Birthdate: Patient birthdate: [birth year]. Age: Patient is 48 yr old. Sex: Gender: male. BMI: 32.7 kg/m^2. Blood pressure: 146/88 Patient status: Inpatient. Study date: Study date: 03/04/2014. Study time: 11:31 AM.   BNP    Component Value Date/Time   PROBNP 6303.0* 03/03/2014 1210    Education Assessment and Provision:  Detailed education and instructions provided on heart failure disease  management including the following:  Signs and symptoms of Heart Failure When to call the physician Importance of daily weights Low sodium diet Fluid restriction Medication  management Anticipated future follow-up appointments  Patient education given on each of the above topics.  Patient acknowledges understanding and acceptance of all instructions.  [name] and I spoke at length regarding his HF diagnosis.  He says that this happened "some time ago" and that he had done well for a while --started feeling better and "got lazy".  Of note there is no mention in his chart of HF prior to this admission.  He is able to teach back all of the above topics.  He does not own a scale however says that he and his daughter are going today to get a scale and shop for low sodium foods.  His daughter works on [employer].  .  Education Materials:  "Living Better With Heart Failure" Booklet, Daily Weight Tracker Tool   High Risk Criteria for Readmission and/or Poor Patient Outcomes:  (Recommend Follow-up with Advanced Heart Failure Clinic)--Yes   EF <30%-yes--15%  2 or more admissions in 6 months- No  Difficult social situation- No  Demonstrates medication noncompliance- Yes--recently ran out of prescribed medications for 3 weeks prior to admission  Barriers of Care:  New HF--Knowledge, compliance  Discharge Planning:   Plans to discharge to home alone.  He has outpatient appt in the AHF clinic next Friday Sept 4th.Marland Kitchen

## 2014-03-06 NOTE — Progress Notes (Signed)
    SUBJECTIVE:  No pain.  He thinks that his breathing is back to baseline.     PHYSICAL EXAM Filed Vitals:   03/05/14 1322 03/05/14 1608 03/05/14 2100 03/06/14 0350  BP: 127/88 135/83 134/93 144/97  Pulse: 88 85 86 83  Temp: 97.5 F (36.4 C)  98.2 F (36.8 C) 97.6 F (36.4 C)  TempSrc: Oral  Oral Oral  Resp: Height:      Weight:    246 lb 4.1 oz (111.7 kg)  SpO2: 96%  99% 100%   General:  No distress Lungs:  Clear Heart:  RRR Abdomen:  Positive bowel sounds, no rebound no guarding Extremities:  NO edema.  JVD:  To jaw. Neuro:  Nonfocal  LABS:  No results found for this or any previous visit (from the past 24 hour(s)).  Intake/Output Summary (Last 24 hours) at 03/06/14 0818 Last data filed at 03/06/14 0600  Gross per 24 hour  Intake   1120 ml  Output   2000 ml  Net   -880 ml     ASSESSMENT AND PLAN:  ACUTE SYSTOLIC HEART FAILURE:   He is better from a volume standpoint.  He still has JVD and needs continued PO diuretic and afterload reduction.  Avoiding ACE or ARB for now but we will transition.  He will need close follow up as an outpatient.  Of note he does have RV dysfunction as well as severe LV dysfunction.      CKD:  I suspect also HTN CKD.   Creat is slightly better.    HTN:  This is being managed in the context of treating his CHF  SLEEP APNEA:   He will need referral for treatment of this at discharge.   DM:  A1C is 6.9.   To start metformin.   I discussed this with the advanced heart failure team and they will arrange a transition of care appt for next week.  OK to discharge from my standpoint.    Fayrene Fearing [facility] 03/06/2014 8:18 AM

## 2014-03-06 NOTE — Discharge Summary (Signed)
Physician Discharge Summary  Derek Chapman ZOX:[id number] DOB: 1965/08/28 DOA: 03/03/2014  PCP: No PCP Per Patient  Admit date: 03/03/2014 Discharge date: 03/06/2014  Time spent: 35 minutes  Recommendations for Outpatient Follow-up:  1. Amy Clegg CHF on 9/4, bmet in 1 week 2. Outpatient  Myoview  Discharge Diagnoses:      Acute systolic congestive heart failure   Accelerated hypertension   Hypokalemia   Stable angina   AKI   CKD (chronic kidney disease) stage 3, GFR 30-59 ml/min   Diabetes mellitus-New   Discharge Condition:stable  Diet recommendation:low sodium, diabetic  Filed Weights   03/04/14 0452 03/05/14 0533 03/06/14 0350  Weight: 109.2 kg (240 lb 11.9 oz) 110.587 kg (243 lb 12.8 oz) 111.7 kg (246 lb 4.1 oz)    History of present illness:   48 year old male with a history of hypertension and cocaine use presents to the emergency department with three-week history of progressive shortness of breath. The patient states that he has gained 12 pounds in the past month. He ran out of his amlodipine approximately 2-3 weeks ago. Prior to his amlodipine, the patient used to take atenolol, but he stated that this made him short of breath and more fatigued. He has been complaining of symptoms of dyspnea on exertion, leg edema, 6 pillow orthopnea, and PND. In addition, he has had some neck discomfort particularly when he is under exertion with associated shortness of breath and sour stomach feeling. He denies any fevers, chills, vomiting, abdominal pain, diarrhea, dysuria, hematuria, hematochezia, melena. Notably, the patient states that he last used cocaine 2-3 months ago. He quit smoking in 2006. He drinks alcohol socially.  In the emergency department, potassium is 3.3, serum creatinine 1.57. CBC was unremarkable. ProBNP was 6303. D-dimer was 0.39. Chest x-ray was without any pulmonary edema but showedvascular congestion. EKG was sinus rhythm with nonspecific T-wave changes.  Hospital  Course:  1. Acute Systolic CHF -EF of 15% based on ECHO  -Per Cards high suspicion of NICM  -Plan for Stress test as outpatient  -Diuresed with IV lasix to good effect and changed to PO -lasix changed to PO, with clinical improvement  -diet education completed -No ACE due to AKI/CKD, started on bidil for afterload reduction  2. Cocaine abuse  -counseled   3. HTN  -BP improved with diruesis  -continue coreg and bidil   4. AKI on CKD 2  -creatinine bumped with diuresis  -changed lasix to PO  -NO ACE for now  -improved, creatinine 1.6 at discharge  5. New DM -Hbaic 6.9, Diet education completed -start metformin at DC      Consultations:  Cards  Discharge Exam: Filed Vitals:   03/06/14 0350  BP: 144/97  Pulse: 83  Temp: 97.6 F (36.4 C)  Resp: 20    General: AAOx3 Cardiovascular: S1S2/RRR Respiratory: CTAB  Discharge Instructions You were cared for by a hospitalist during your hospital stay. If you have any questions about your discharge medications or the care you received while you were in the hospital after you are discharged, you can call the unit and asked to speak with the hospitalist on call if the hospitalist that took care of you is not available. Once you are discharged, your primary care physician will handle any further medical issues. Please note that NO REFILLS for any discharge medications will be authorized once you are discharged, as it is imperative that you return to your primary care physician (or establish a relationship with a primary care physician  if you do not have one) for your aftercare needs so that they can reassess your need for medications and monitor your lab values.  Discharge Instructions   Diet - low sodium heart healthy    Complete by:  As directed      Diet Carb Modified    Complete by:  As directed      Increase activity slowly    Complete by:  As directed             Medication List    STOP taking these medications        amLODipine 10 MG tablet  Commonly known as:  NORVASC      TAKE these medications       albuterol 108 (90 BASE) MCG/ACT inhaler  Commonly known as:  PROVENTIL HFA;VENTOLIN HFA  Inhale 1-2 puffs into the lungs every 6 (six) hours as needed for wheezing or shortness of breath.     carvedilol 12.5 MG tablet  Commonly known as:  COREG  Take 1 tablet (12.5 mg total) by mouth 2 (two) times daily with a meal.     furosemide 80 MG tablet  Commonly known as:  LASIX  Take 1 tablet (80 mg total) by mouth daily.     isosorbide-hydrALAZINE 20-37.5 MG per tablet  Commonly known as:  BIDIL  Take 1 tablet by mouth 3 (three) times daily.     metFORMIN 500 MG tablet  Commonly known as:  GLUCOPHAGE  Take 1 tablet (500 mg total) by mouth daily with breakfast.     potassium chloride SA 20 MEQ tablet  Commonly known as:  K-DUR,KLOR-CON  Take 2 tablets (40 mEq total) by mouth 2 (two) times daily.       No Known Allergies     Follow-up Information   Follow up with CLEGG,AMY, NP On 03/13/2014. ([facility] located on the 1st floor at [facility] at 11:15 Gar[address] 0050 )    Specialty:  Nurse Practitioner   Contact information:   [address] [place] [address] [phone]        The results of significant diagnostics from this hospitalization (including imaging, microbiology, ancillary and laboratory) are listed below for reference.    Significant Diagnostic Studies: Dg Chest 2 View  03/03/2014   CLINICAL DATA:  Fever.  EXAM: CHEST  2 VIEW  COMPARISON:  05/13/2013.  FINDINGS: The heart is enlarged. There are no infiltrates or failure. There is no effusion or pneumothorax. Bones are unremarkable. No change from priors.  IMPRESSION: Cardiomegaly, no active disease.   Electronically Signed   By: Davonna Belling M.D.   On: 03/03/2014 13:43    Microbiology: No results found for this or any previous visit (from the past 240 hour(s)).   Labs: Basic Metabolic Panel:  Recent  Labs Lab 03/03/14 1210 03/04/14 0526 03/05/14 0641 03/06/14 0715  NA 139 143 140 141  K 3.3* 3.1* 3.6* 3.9  CL 100 102 100 102  CO2 GLUCOSE 144* 124* 106* 111*  BUN 25* 26* 28* 25*  CREATININE 1.57* 1.95* 1.71* 1.61*  CALCIUM 9.2 8.8 8.5 8.6   Liver Function Tests: No results found for this basename: AST, ALT, ALKPHOS, BILITOT, PROT, ALBUMIN,  in the last 168 hours No results found for this basename: LIPASE, AMYLASE,  in the last 168 hours No results found for this basename: AMMONIA,  in the last 168 hours CBC:  Recent Labs Lab 03/03/14 1210  WBC  5.5  HGB 12.8*  HCT 37.1*  MCV 63.9*  PLT 216   Cardiac Enzymes:  Recent Labs Lab 03/03/14 1800 03/03/14 2227 03/04/14 0526  TROPONINI <0.30 <0.30 <0.30   BNP: BNP (last 3 results)  Recent Labs  05/13/13 2347 03/03/14 1210  PROBNP 1513.0* 6303.0*   CBG: No results found for this basename: GLUCAP,  in the last 168 hours     Signed:  Saranne Crislip  [facility] 03/06/2014, 9:36 AM

## 2014-03-06 NOTE — Progress Notes (Signed)
Pharmacist Heart Failure Core Measure Documentation  Assessment: Daelan Gatt has an EF documented as 15% 8/26 on 03/04/14 by ECHO.  Rationale: Heart failure patients with left ventricular systolic dysfunction (LVSD) and an EF < 40% should be prescribed an angiotensin converting enzyme inhibitor (ACEI) or angiotensin receptor blocker (ARB) at discharge unless a contraindication is documented in the medical record.  This patient is not currently on an ACEI or ARB for HF.  This note is being placed in the record in order to provide documentation that a contraindication to the use of these agents is present for this encounter.  ACE Inhibitor or Angiotensin Receptor Blocker is contraindicated (specify all that apply)    ACEI allergy AND ARB allergy   Angioedema   Moderate or severe aortic stenosis   Hyperkalemia   Hypotension   Renal artery stenosis   Worsening renal function, preexisting renal disease or dysfunction   Landy Dunnavant Merrily Brittle 03/06/2014 9:05 AM

## 2014-03-06 NOTE — Progress Notes (Signed)
At 1145 am pt received all d./c instructions from charge nurse [employer], RN.  D/c off floor via w/c to awaiting transport.

## 2014-03-12 NOTE — Progress Notes (Signed)
Patient ID: Derek Chapman, male   DOB: Nov 01, 1965, 48 y.o.   MRN: [id number] PCP: None    HPI: Mr Gelb is a 48 year old with a history of hypertension, kidney stones, former coacaine abuse (last used 6 months ago) and marijuana use, tobacco abuse from 2003 to about 2006 and newly diagnosed systolic heart failure in August thought to be from HTN. He has not had ischemic work up. Admitted to [facility] August 25  With chest pain and increased dyspnea.He was  diuresed with IV lasix and transitioned to 80 mg lasix daily. He was started on carvedilol, bidil, and lasix. Discharge weight 246 pounds.   He presents to day for post hospital follow up. Overall feeling much better. Denies SOB/PND/Orthopnea/CP. Taking all medications. Weight at home trending down after discharge from 246 to 233 pounds. Denies dizziness. No lower extremity edema. Working full time again. He does not have PCP. 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 Former Cocaine Abuse. Drinks a glass of wine 1-2 times per week. Has 5 children . Lives alone.   FH : Father HTN [place]         Mom DM  ROS: All systems negative except as listed in HPI, PMH and Problem List.  Past Medical History  Diagnosis Date  . Kidney stone   . Hypertension     Current Outpatient Prescriptions  Medication Sig Dispense Refill  . albuterol (PROVENTIL HFA;VENTOLIN HFA) 108 (90 BASE) MCG/ACT inhaler Inhale 1-2 puffs into the lungs every 6 (six) hours as needed for wheezing or shortness of breath.      Marland Kitchen aspirin 81 MG tablet Take 81 mg by mouth daily.      . carvedilol (COREG) 12.5 MG tablet Take 1.5 tablets (18.75 mg total) by mouth 2 (two) times daily with a meal.  30 tablet  0  . furosemide (LASIX) 80 MG tablet Take 1 tablet (80 mg total) by mouth daily.  30 tablet  0  . isosorbide-hydrALAZINE (BIDIL) 20-37.5 MG per  tablet Take 1 tablet by mouth 3 (three) times daily.  90 tablet  0  . metFORMIN (GLUCOPHAGE) 500 MG tablet Take 1 tablet (500 mg total) by mouth daily with breakfast.  30 tablet  0  . potassium chloride SA (K-DUR,KLOR-CON) 20 MEQ tablet Take 2 tablets (40 mEq total) by mouth 2 (two) times daily.  60 tablet  1   No current facility-administered medications for this encounter.     PHYSICAL EXAM: Filed Vitals:   03/13/14 1108  BP: 128/74  Pulse: 86  Weight: 237 lb 4 oz (107.616 kg)  SpO2: 100%    General:  Well appearing. No resp difficulty. Ambulate in the clinic without difficulty.  HEENT: normal Neck: supple. JVP 5-6 . Carotids 2+ bilaterally; no bruits. No lymphadenopathy or thryomegaly appreciated. Cor: PMI normal. Regular rate & rhythm. No rubs, gallops or murmurs. No S3 Lungs: clear Abdomen: soft, nontender, nondistended. No hepatosplenomegaly. No bruits or masses. Good bowel sounds. Extremities: no cyanosis, clubbing, rash, edema Neuro: alert & orientedx3, cranial nerves grossly intact. Moves all 4 extremities w/o difficulty. Affect pleasant.      ASSESSMENT & PLAN: 1. Chronic Systolic Heart Failure ECHO EF 15% . He has not had an ischemic work up. Not having chest pain so will not cath also elevated creatinine. Set up  Myoview today .  NYHA II. Volume status stable. Continue lasix 80 mg daily. Discussed sliding scale diuretics. Instructed to hold lasix for weight less than 231 pounds and take an extra 40 mg of lasix for weight 238 pounds or greater.  Increase carvedilol 18.75 mg twice a day Continue Bidil 20-37.5 mg tid. He understands he can not take Viagra with isordil.  He is not on Ace or spironolactone due to CKD. Hopefully can add lisinopril next week.  Plan to repeat ECHO after HF meds optimized 3-4 months (early December). QRS narrow.  IF remains down after HF meds optimized will need to consider ICD and request medtronic for HF diagnostics.  Check BMET, Pro BNP,  HIV, SPEP Reinforced daily, low salt food choices, and limiting fluid intake to < 2 liters.  Consider for Guide IT Study 2. HTN -Improved. Continue Bidil 20-37.5 mg tid. Increase carvedilol 18.75 mg twice.  3. CKD- Creatinine baseline 1.6-1.8 . Protein noted in urine from 05/2013. Refer to nephrology  4. DM- newly diagnosed - Hgb A1C On metformin 500 mg daily. I [facility] to [facility] as new patient.   needs PCP.  5. Former Smoker Quit 2006 6.  Former Cocaine Abuse. Has not used in 6 months   Follow up in 2 weeks for medication titration.    Jena Tegeler NP-C  12:19 PM

## 2014-03-13 ENCOUNTER — Ambulatory Visit (HOSPITAL_COMMUNITY)
Admit: 2014-03-13 | Discharge: 2014-03-13 | Disposition: A | Discharge status: Home / Self Care | Payer: PRIVATE HEALTH INSURANCE | Attending: Internal Medicine | Admitting: Internal Medicine

## 2014-03-13 VITALS — BP 128/74 | HR 86 | Wt 237.2 lb

## 2014-03-13 DIAGNOSIS — I129 Hypertensive chronic kidney disease with stage 1 through stage 4 chronic kidney disease, or unspecified chronic kidney disease: Secondary | ICD-10-CM | POA: Insufficient documentation

## 2014-03-13 DIAGNOSIS — I5021 Acute systolic (congestive) heart failure: Secondary | ICD-10-CM

## 2014-03-13 DIAGNOSIS — I5023 Acute on chronic systolic (congestive) heart failure: Secondary | ICD-10-CM | POA: Diagnosis present

## 2014-03-13 DIAGNOSIS — Z79899 Other long term (current) drug therapy: Secondary | ICD-10-CM | POA: Insufficient documentation

## 2014-03-13 DIAGNOSIS — I509 Heart failure, unspecified: Secondary | ICD-10-CM | POA: Diagnosis not present

## 2014-03-13 DIAGNOSIS — E119 Type 2 diabetes mellitus without complications: Secondary | ICD-10-CM | POA: Insufficient documentation

## 2014-03-13 DIAGNOSIS — I10 Essential (primary) hypertension: Secondary | ICD-10-CM

## 2014-03-13 DIAGNOSIS — N183 Chronic kidney disease, stage 3 unspecified: Secondary | ICD-10-CM | POA: Diagnosis not present

## 2014-03-13 DIAGNOSIS — Z87442 Personal history of urinary calculi: Secondary | ICD-10-CM | POA: Diagnosis not present

## 2014-03-13 DIAGNOSIS — Z87891 Personal history of nicotine dependence: Secondary | ICD-10-CM | POA: Diagnosis not present

## 2014-03-13 DIAGNOSIS — I5022 Chronic systolic (congestive) heart failure: Secondary | ICD-10-CM

## 2014-03-13 LAB — BASIC METABOLIC PANEL
ANION GAP: 14 (ref 5–15)
BUN: 27 mg/dL — ABNORMAL HIGH (ref 6–23)
CHLORIDE: 99 meq/L (ref 96–112)
CO2: 27 meq/L (ref 19–32)
Calcium: 8.6 mg/dL (ref 8.4–10.5)
Creatinine, Ser: 1.56 mg/dL — ABNORMAL HIGH (ref 0.50–1.35)
GFR calc Af Amer: 60 mL/min — ABNORMAL LOW (ref >90)
GFR calc non Af Amer: 52 mL/min — ABNORMAL LOW (ref >90)
GLUCOSE: 90 mg/dL (ref 70–99)
POTASSIUM: 3.7 meq/L (ref 3.7–5.3)
SODIUM: 140 meq/L (ref 137–147)

## 2014-03-13 LAB — PRO B NATRIURETIC PEPTIDE: Pro B Natriuretic peptide (BNP): 2623 pg/mL — ABNORMAL HIGH (ref 0–125)

## 2014-03-13 LAB — HIV ANTIBODY (ROUTINE TESTING W REFLEX): HIV 1&2 Ab, 4th Generation: NONREACTIVE

## 2014-03-13 MED ORDER — CARVEDILOL 12.5 MG PO TABS: 18.7500 mg | ORAL_TABLET | Freq: Two times a day (BID) | ORAL | Status: DC

## 2014-03-13 NOTE — Patient Instructions (Signed)
Follow up in 2 weeks  Take carvedilol 18.75 mg twice a day which is 1 1/2 tablets per day  Hold Furosemide if your weight is 230 pounds or less  Take and extra 40 mg of lasix ( this is 1/2 tablet)  if your weight 238 pounds or greater  Do the following things EVERYDAY: 1) Weigh yourself in the morning before breakfast. Write it down and keep it in a log. 2) Take your medicines as prescribed 3) Eat low salt foods-Limit salt (sodium) to 2000 mg per day.  4) Stay as active as you can everyday 5) Limit all fluids for the day to less than 2 liters

## 2014-03-13 NOTE — Addendum Note (Signed)
Encounter addended by: Ave Filter, RN on: 03/13/2014  4:26 PM<BR>     Documentation filed: Demographics Visit

## 2014-03-18 LAB — PROTEIN ELECTROPHORESIS, SERUM
ALPHA-1-GLOBULIN: 8.7 % — AB (ref 2.9–4.9)
ALPHA-2-GLOBULIN: 10.9 % (ref 7.1–11.8)
Albumin ELP: 48 % — ABNORMAL LOW (ref 55.8–66.1)
Beta 2: 6.8 % — ABNORMAL HIGH (ref 3.2–6.5)
Beta Globulin: 7.8 % — ABNORMAL HIGH (ref 4.7–7.2)
GAMMA GLOBULIN: 17.8 % (ref 11.1–18.8)
M-Spike, %: NOT DETECTED g/dL
TOTAL PROTEIN ELP: 6.4 g/dL (ref 6.0–8.3)

## 2014-03-26 ENCOUNTER — Ambulatory Visit (HOSPITAL_COMMUNITY): Payer: PRIVATE HEALTH INSURANCE | Attending: Adult Health | Admitting: Radiology

## 2014-03-26 VITALS — BP 114/65 | HR 93 | Ht 72.0 in | Wt 242.0 lb

## 2014-03-26 DIAGNOSIS — R5381 Other malaise: Secondary | ICD-10-CM | POA: Insufficient documentation

## 2014-03-26 DIAGNOSIS — I5021 Acute systolic (congestive) heart failure: Secondary | ICD-10-CM

## 2014-03-26 DIAGNOSIS — R5383 Other fatigue: Secondary | ICD-10-CM

## 2014-03-26 DIAGNOSIS — R0602 Shortness of breath: Secondary | ICD-10-CM | POA: Diagnosis not present

## 2014-03-26 DIAGNOSIS — E119 Type 2 diabetes mellitus without complications: Secondary | ICD-10-CM | POA: Diagnosis not present

## 2014-03-26 DIAGNOSIS — R079 Chest pain, unspecified: Secondary | ICD-10-CM | POA: Diagnosis present

## 2014-03-26 DIAGNOSIS — I10 Essential (primary) hypertension: Secondary | ICD-10-CM | POA: Insufficient documentation

## 2014-03-26 MED ORDER — TECHNETIUM TC 99M SESTAMIBI GENERIC - CARDIOLITE
10.0000 | Freq: Once | INTRAVENOUS | Status: AC | PRN
  Administered 2014-03-26: 10 via INTRAVENOUS

## 2014-03-26 MED ORDER — REGADENOSON 0.4 MG/5ML IV SOLN
0.4000 mg | Freq: Once | INTRAVENOUS | Status: AC
  Administered 2014-03-26: 0.4 mg via INTRAVENOUS

## 2014-03-26 MED ORDER — TECHNETIUM TC 99M SESTAMIBI GENERIC - CARDIOLITE
30.0000 | Freq: Once | INTRAVENOUS | Status: AC | PRN
  Administered 2014-03-26: 30 via INTRAVENOUS

## 2014-03-26 NOTE — Progress Notes (Signed)
[facility] SITE 3 NUCLEAR MED [address] [place], [place] [address] [phone]    Cardiology Nuclear Med Study  [address] [place] is a 48 y.o. male     MRN : [id number]     DOB: 28-Sep-1965  Procedure Date: 03/26/2014  Nuclear Med Background Indication for Stress Test:  Evaluation for Ischemia History:  No known CAD Cardiac Risk Factors: Hypertension and NIDDM  Symptoms:  Chest Pain (last date of chest discomfort was three weeks ago), Fatigue and SOB   Nuclear Pre-Procedure Caffeine/Decaff Intake:  None NPO After: 6:00am   Lungs:  clear O2 Sat: 97% on room air. IV 0.9% NS with Angio Cath:  22g  IV Site: R Hand  IV Started by:  Bonnita Levan, RN  Chest Size (in): 46 Cup Size: n/a  Height: 6' (1.829 m)  Weight:  242 lb (109.77 kg)  BMI:  Body mass index is 32.81 kg/(m^2). Tech Comments:  Coreg held x 24 hrs    Nuclear Med Study 1 or 2 day study: 1 day  Stress Test Type:  Stress  Reading MD: N/A  Order Authorizing Provider:  Arvilla Meres, MD  Resting Radionuclide: Technetium 64m Sestamibi  Resting Radionuclide Dose: 11.0 mCi   Stress Radionuclide:  Technetium 15m Sestamibi  Stress Radionuclide Dose: 33.0 mCi           Stress Protocol Rest HR: 93 Stress HR: 104  Rest BP: 114/65 Stress BP: 181/107  Exercise Time (min): n/a METS: n/a           Dose of Adenosine (mg):  n/a Dose of Lexiscan: 0.4 mg  Dose of Atropine (mg): n/a Dose of Dobutamine: n/a mcg/kg/min (at max HR)  Stress Test Technologist: Nelson Chimes, BS-ES  Nuclear Technologist:  Jackquline Bosch     Rest Procedure:  Myocardial perfusion imaging was performed at rest 45 minutes following the intravenous administration of Technetium 49m Sestamibi. Rest ECG: NSR - Normal EKG  Stress Procedure:  The patient received IV Lexiscan 0.4 mg over 15-seconds.  Technetium 23m Sestamibi injected at 30-seconds.  Quantitative spect images were obtained after a 45 minute delay.  During the infusion of  Lexiscan the patient complained of SOB and lightheadedness.  These symptoms resolved in recovery.  Stress ECG: No significant change from baseline ECG  QPS Raw Data Images:  Normal; no motion artifact; normal heart/lung ratio. Stress Images:  Medium-sized, mild basal to mid inferior perfusion defect.  Rest Images:  Medium-sized, mild basal to mid inferior perfusion defect.  Subtraction (SDS):  Fixed medium-sized, mild basal to mid inferior perfusion defect.  Transient Ischemic Dilatation (Normal <1.22):  1.15 Lung/Heart Ratio (Normal <0.45):  0.45  Quantitative Gated Spect Images QGS EDV:  259 ml QGS ESV:  208 ml  Impression Exercise Capacity:  Lexiscan with no exercise. BP Response:  Hypertensive blood pressure response. Clinical Symptoms:  Dyspnea ECG Impression:  No significant ST segment change suggestive of ischemia.  Occasional PVCs.  Comparison with Prior Nuclear Study: No images to compare  Overall Impression:  High risk stress nuclear study with a fixed medium-sized mild basal to mid inferior perfusion defect.  I suspect that this is attenuation.  Doubt infarction, no ischemia. High risk due to low EF. .  LV Ejection Fraction: 20%.  LV Wall Motion:  Diffuse hypokinesis.   Marca Ancona 03/26/2014

## 2014-03-27 ENCOUNTER — Encounter (HOSPITAL_COMMUNITY): Payer: Self-pay

## 2014-03-30 ENCOUNTER — Inpatient Hospital Stay (HOSPITAL_COMMUNITY): Admission: RE | Admit: 2014-03-30 | Payer: PRIVATE HEALTH INSURANCE | Source: Ambulatory Visit

## 2014-03-30 HISTORY — DX: Chronic kidney disease, stage 3 unspecified: N18.30

## 2014-03-30 HISTORY — DX: Chronic kidney disease, stage 3 (moderate): N18.3

## 2014-03-30 HISTORY — DX: Chronic systolic (congestive) heart failure: I50.22

## 2014-03-30 HISTORY — DX: Cardiomyopathy, unspecified: I42.9

## 2014-04-16 ENCOUNTER — Telehealth (HOSPITAL_COMMUNITY): Payer: Self-pay | Admitting: Vascular Surgery

## 2014-04-16 DIAGNOSIS — I509 Heart failure, unspecified: Secondary | ICD-10-CM

## 2014-04-16 NOTE — Telephone Encounter (Signed)
Pt called answering service this evening, apparently requesting refills.  I called him back however his phone went to voice mail.  Will await call back from patient.

## 2014-04-16 NOTE — Telephone Encounter (Signed)
Refill Metformin , Isosorbide and Carvedilol

## 2014-04-17 MED ORDER — CARVEDILOL 12.5 MG PO TABS: 18.7500 mg | ORAL_TABLET | Freq: Two times a day (BID) | ORAL | Status: DC

## 2014-04-17 MED ORDER — METFORMIN HCL 500 MG PO TABS: 500.0000 mg | ORAL_TABLET | Freq: Every day | ORAL | Status: AC

## 2014-04-17 MED ORDER — ISOSORB DINITRATE-HYDRALAZINE 20-37.5 MG PO TABS: 1.0000 | ORAL_TABLET | Freq: Three times a day (TID) | ORAL | Status: DC

## 2014-04-17 NOTE — Telephone Encounter (Signed)
Refilled meds requested 

## 2014-04-19 NOTE — Progress Notes (Addendum)
Patient ID: Derek FlemingJewel Chapman, male   DOB: 12/29/1965, 48 y.o.   MRN: [id number] PCP:    HPI: Derek Chapman is a 48 year old with a history of hypertension, kidney stones, former coacaine abuse (last used 6 months ago) and marijuana use, tobacco abuse from 2003 to about 2006 and newly diagnosed systolic heart failure in August thought to be from HTN. He has not had ischemic work up. [facility]  With chest pain and increased dyspnea.He was  diuresed with IV lasix and transitioned to 80 mg lasix daily. He was started on carvedilol, bidil, and lasix. Discharge weight 246 pounds.   He presents to day for  follow up. Last visit carvediol increased to 18.75 mg twice a day. Today he is SOB with exertion and complaining of fatigue. + Orthopnea. He has been out of medications for the last 5 days. Weight at home up to 248 pounds. Eating high salt foods. He working 12 hours a day. His girlfriend says he stops breathing at night.  Labs 03/06/14 K 3.9 Creatinine 1.61 Labs 03/03/14 Hgb 12.8  TSH 1.8 Pro BNP 6303 Hgb A1C 6.9 Labs 03/13/14 SPEP no M spikes. 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 Drinks a glass of wine 1-2 times per week. Has 5 children . Lives alone.   FH : Father HTN [place]         Mom DM  ROS: All systems negative except as listed in HPI, PMH and Problem List.  Past Medical History  Diagnosis Date  . Kidney stone   . Hypertension   . CKD (chronic kidney disease), stage III   . Chronic systolic HF (heart failure)     a) ECHO (02/2014): EF 15%, grade II DD, mild Derek, RV mildly dilated and systolic fx mod reduced  . Cardiomyopathy     a) thought to be NICM r/t HTN b) Lexiscan Myoview (03/2014): fixed medium-sized mild basal to mid inferior perfusion defect, EF 20%, doubt infarction, no ischemia    Current Outpatient Prescriptions  Medication Sig  Dispense Refill  . albuterol (PROVENTIL HFA;VENTOLIN HFA) 108 (90 BASE) MCG/ACT inhaler Inhale 1-2 puffs into the lungs every 6 (six) hours as needed for wheezing or shortness of breath.      Marland Kitchen. aspirin 81 MG tablet Take 81 mg by mouth daily.      . metFORMIN (GLUCOPHAGE) 500 MG tablet Take 1 tablet (500 mg total) by mouth daily with breakfast.  30 tablet  0  . carvedilol (COREG) 12.5 MG tablet Take 1.5 tablets (18.75 mg total) by mouth 2 (two) times daily with a meal.  90 tablet  3  . furosemide (LASIX) 80 MG tablet Take 1 tablet (80 mg total) by mouth daily.  30 tablet  3  . isosorbide-hydrALAZINE (BIDIL) 20-37.5 MG per tablet Take 1 tablet by mouth 3 (three) times daily.  90 tablet  3  . potassium chloride SA (K-DUR,KLOR-CON) 20 MEQ tablet Take 2 tablets (40 mEq total) by mouth 2 (two) times daily.  120 tablet  3   No current facility-administered medications for this encounter.     PHYSICAL EXAM: Filed Vitals:   04/20/14 0850  BP: 153/93  Pulse: 104  Resp: 20  Weight: 248 lb (112.492 kg)  SpO2: 99%    General:  Well appearing. No resp difficulty.  Ambulate in the clinic without difficulty.  HEENT: normal Neck: supple. JVP 5-6 . Carotids 2+ bilaterally; no bruits. No lymphadenopathy or thryomegaly appreciated. Cor: PMI normal. Regular rate & rhythm. No rubs, gallops or murmurs. +S3 Lungs: clear Abdomen: soft, nontender, nondistended. No hepatosplenomegaly. No bruits or masses. Good bowel sounds. Extremities: no cyanosis, clubbing, rash, RLE LLE trace to 1_+edema Neuro: alert & orientedx3, cranial nerves grossly intact. Moves all 4 extremities w/o difficulty. Affect pleasant.      ASSESSMENT & PLAN: 1. Chronic Systolic Heart Failure ECHO EF 15% . Myoview ok.    NYHA III with dyspnea on exertion. Volume status elevated but not surprising as he has been out of his medications for the last  5 days. Today I am restarting all HF medications.  Continue lasix 80 mg daily. .   IContinue carvedilol 18.75 mg twice a day Continue Bidil 20-37.5 mg tid. He understands he can not take Viagra with isordil.  He is not on Ace or spironolactone due to CKD.  I have stressed the importance of medication compliance.  Plan to repeat ECHO after HF meds optimized 3-4 months (early December). QRS narrow.  If remains down after HF meds optimized will need to consider ICD and request medtronic for HF diagnostics.  Reinforced daily, low salt food choices, and limiting fluid intake to < 2 liters.  2. HTN -Elevated but as above he has been out of his medications. HF meds restarted today as above. .  3. CKD- Creatinine baseline 1.6-1.8 . Protein noted in urine from 05/2013. Refer to nephrology  4. DM- newly diagnosed - Hgb A1C 6.9. On metformin 500 mg daily. Has follow up with PCP this week.  5. Former Smoker Quit 2006 6.  Former Cocaine Abuse. Has not used in 6 months  7. Suspect OSA- Refer to pulmonary for sleep study.   Follow up in 2 weeks for medication titration. Check BMET    CLEGG,AMY NP-C  8:59 AM

## 2014-04-20 ENCOUNTER — Ambulatory Visit (HOSPITAL_COMMUNITY)
Admission: RE | Admit: 2014-04-20 | Discharge: 2014-04-20 | Disposition: A | Discharge status: Home / Self Care | Payer: PRIVATE HEALTH INSURANCE | Source: Ambulatory Visit | Attending: Cardiology | Admitting: Cardiology

## 2014-04-20 ENCOUNTER — Encounter (HOSPITAL_COMMUNITY): Payer: Self-pay

## 2014-04-20 ENCOUNTER — Other Ambulatory Visit (HOSPITAL_COMMUNITY): Payer: Self-pay

## 2014-04-20 VITALS — BP 153/93 | HR 104 | Resp 20 | Wt 248.0 lb

## 2014-04-20 DIAGNOSIS — E119 Type 2 diabetes mellitus without complications: Secondary | ICD-10-CM | POA: Insufficient documentation

## 2014-04-20 DIAGNOSIS — F1421 Cocaine dependence, in remission: Secondary | ICD-10-CM | POA: Insufficient documentation

## 2014-04-20 DIAGNOSIS — Z87891 Personal history of nicotine dependence: Secondary | ICD-10-CM | POA: Insufficient documentation

## 2014-04-20 DIAGNOSIS — I5020 Unspecified systolic (congestive) heart failure: Secondary | ICD-10-CM | POA: Diagnosis present

## 2014-04-20 DIAGNOSIS — I5022 Chronic systolic (congestive) heart failure: Secondary | ICD-10-CM | POA: Insufficient documentation

## 2014-04-20 DIAGNOSIS — I129 Hypertensive chronic kidney disease with stage 1 through stage 4 chronic kidney disease, or unspecified chronic kidney disease: Secondary | ICD-10-CM | POA: Diagnosis not present

## 2014-04-20 DIAGNOSIS — I429 Cardiomyopathy, unspecified: Secondary | ICD-10-CM | POA: Diagnosis not present

## 2014-04-20 DIAGNOSIS — Z79899 Other long term (current) drug therapy: Secondary | ICD-10-CM | POA: Insufficient documentation

## 2014-04-20 DIAGNOSIS — N183 Chronic kidney disease, stage 3 unspecified: Secondary | ICD-10-CM

## 2014-04-20 DIAGNOSIS — Z7982 Long term (current) use of aspirin: Secondary | ICD-10-CM | POA: Diagnosis not present

## 2014-04-20 DIAGNOSIS — N189 Chronic kidney disease, unspecified: Secondary | ICD-10-CM | POA: Insufficient documentation

## 2014-04-20 DIAGNOSIS — I509 Heart failure, unspecified: Secondary | ICD-10-CM

## 2014-04-20 DIAGNOSIS — I10 Essential (primary) hypertension: Secondary | ICD-10-CM

## 2014-04-20 DIAGNOSIS — Z87442 Personal history of urinary calculi: Secondary | ICD-10-CM | POA: Diagnosis not present

## 2014-04-20 MED ORDER — FUROSEMIDE 80 MG PO TABS: 80.0000 mg | ORAL_TABLET | Freq: Every day | ORAL | Status: DC

## 2014-04-20 MED ORDER — CARVEDILOL 12.5 MG PO TABS: 18.7500 mg | ORAL_TABLET | Freq: Two times a day (BID) | ORAL | Status: DC

## 2014-04-20 MED ORDER — ISOSORB DINITRATE-HYDRALAZINE 20-37.5 MG PO TABS: 1.0000 | ORAL_TABLET | Freq: Three times a day (TID) | ORAL | Status: DC

## 2014-04-20 MED ORDER — POTASSIUM CHLORIDE CRYS ER 20 MEQ PO TBCR: 40.0000 meq | EXTENDED_RELEASE_TABLET | Freq: Two times a day (BID) | ORAL | Status: DC

## 2014-04-20 NOTE — Patient Instructions (Signed)
Follow up in 2 weeks   Please as PCP to fax lab work to HF clinic [phone]  Do the following things EVERYDAY: 1) Weigh yourself in the morning before breakfast. Write it down and keep it in a log. 2) Take your medicines as prescribed 3) Eat low salt foods-Limit salt (sodium) to 2000 mg per day.  4) Stay as active as you can everyday 5) Limit all fluids for the day to less than 2 liters

## 2014-05-03 NOTE — Progress Notes (Signed)
Patient ID: Derek Chapman, male   DOB: 05/17/1966, 48 y.o.   MRN: [id number] PCP: [facility]  HPI: Derek Chapman is a 48 year old with a history of hypertension, kidney stones, former coacaine abuse (last used 6 months ago) and marijuana use, tobacco abuse from 2003 to about 2006 and newly diagnosed systolic heart failure in August thought to be from HTN. He has not had ischemic work up. [facility]  With chest pain and increased dyspnea.He was  diuresed with IV lasix and transitioned to 80 mg lasix daily. He was started on carvedilol, bidil, and lasix. Discharge weight 246 pounds.   He presents to day for  follow up. Last visit all HF meds restarted. OVerall he is feeling much better. Denies SOB/PND/Orthopnea. Weight at home trending down from 248 to 232 pounds. Taking all medications. Not currently exercising.  Has PCP follow on Thursday.   Labs 03/06/14 K 3.9 Creatinine 1.61 Labs 03/03/14 Hgb 12.8  TSH 1.8 Pro BNP 6303 Hgb A1C 6.9 Labs 03/13/14 SPEP no M spikes. 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 Drinks a glass of wine 1-2 times per week. Has 5 children . Lives alone.   FH : Father HTN [place]         Mom DM  ROS: All systems negative except as listed in HPI, PMH and Problem List.  Past Medical History  Diagnosis Date  . Kidney stone   . Hypertension   . CKD (chronic kidney disease), stage III   . Chronic systolic HF (heart failure)     a) ECHO (02/2014): EF 15%, grade II DD, mild Derek, RV mildly dilated and systolic fx mod reduced  . Cardiomyopathy     a) thought to be NICM r/t HTN b) Lexiscan Myoview (03/2014): fixed medium-sized mild basal to mid inferior perfusion defect, EF 20%, doubt infarction, no ischemia    Current Outpatient Prescriptions  Medication Sig Dispense Refill  . albuterol (PROVENTIL HFA;VENTOLIN HFA) 108  (90 BASE) MCG/ACT inhaler Inhale 1-2 puffs into the lungs every 6 (six) hours as needed for wheezing or shortness of breath.      Marland Kitchen. aspirin 81 MG tablet Take 81 mg by mouth daily.      . carvedilol (COREG) 12.5 MG tablet Take 1.5 tablets (18.75 mg total) by mouth 2 (two) times daily with a meal.  90 tablet  3  . furosemide (LASIX) 80 MG tablet Take 1 tablet (80 mg total) by mouth daily.  30 tablet  3  . isosorbide-hydrALAZINE (BIDIL) 20-37.5 MG per tablet Take 1 tablet by mouth 3 (three) times daily.  90 tablet  3  . metFORMIN (GLUCOPHAGE) 500 MG tablet Take 1 tablet (500 mg total) by mouth daily with breakfast.  30 tablet  0  . potassium chloride SA (K-DUR,KLOR-CON) 20 MEQ tablet Take 2 tablets (40 mEq total) by mouth 2 (two) times daily.  120 tablet  3   No current facility-administered medications for this encounter.     PHYSICAL EXAM: Filed Vitals:   05/04/14 0917  BP: 148/91  Pulse: 81  Resp: 20  Weight: 236 lb 8 oz (107.276 kg)  SpO2: 100%    General:  Well appearing. No resp difficulty. Ambulate in the clinic without difficulty.  HEENT: normal Neck: supple. JVP 5-6 . Carotids 2+ bilaterally; no  bruits. No lymphadenopathy or thryomegaly appreciated. Cor: PMI normal. Regular rate & rhythm. No rubs, gallops or murmurs.  Lungs: clear Abdomen: soft, nontender, nondistended. No hepatosplenomegaly. No bruits or masses. Good bowel sounds. Extremities: no cyanosis, clubbing, rash, RLE LLE trace edema Neuro: alert & orientedx3, cranial nerves grossly intact. Moves all 4 extremities w/o difficulty. Affect pleasant.      ASSESSMENT & PLAN: 1. Chronic Systolic Heart Failure ECHO EF 15% . Myoview ok.    Functional improvement. NYHA II .Volume status stable. Continue lasix 80 mg daily.  Continue carvedilol 18.75 mg twice a day Increase Bidil 40-75 mg tid. He understands he can not take Viagra with isordil.  He is not on Ace or spironolactone due to CKD.  I have stressed the  importance of ongoing medication compliance.  Check BMET today  Plan to repeat ECHO after HF meds optimized 3-4 months (early December). QRS narrow.  If remains down after HF meds optimized will need to consider ICD and request medtronic for HF diagnostics.  Reinforced daily, low salt food choices, and limiting fluid intake to < 2 liters.  2. HTN -Elevated. Increasing Bidil as above.  3. CKD- Creatinine baseline 1.6-1.8 . Protein noted in urine from 05/2013. Refer to nephrology. Check BMET   4. DM- newly diagnosed - Hgb A1C 6.9. On metformin 500 mg daily. Has follow up with PCP this week.  5. Former Smoker Quit 2006 6.  Former Cocaine Abuse. Has not used in over 6 months  7. Suspect OSA- Refer to pulmonary for sleep study.   Follow up in 2 weeks for medication titration. Check BMET    CLEGG,AMY NP-C  9:21 AM

## 2014-05-04 ENCOUNTER — Encounter (HOSPITAL_COMMUNITY): Payer: Self-pay

## 2014-05-04 ENCOUNTER — Ambulatory Visit (HOSPITAL_COMMUNITY)
Admission: RE | Admit: 2014-05-04 | Discharge: 2014-05-04 | Disposition: A | Discharge status: Home / Self Care | Payer: PRIVATE HEALTH INSURANCE | Source: Ambulatory Visit | Attending: Internal Medicine | Admitting: Internal Medicine

## 2014-05-04 VITALS — BP 148/91 | HR 81 | Resp 20 | Wt 236.5 lb

## 2014-05-04 DIAGNOSIS — F1421 Cocaine dependence, in remission: Secondary | ICD-10-CM | POA: Insufficient documentation

## 2014-05-04 DIAGNOSIS — Z7982 Long term (current) use of aspirin: Secondary | ICD-10-CM | POA: Diagnosis not present

## 2014-05-04 DIAGNOSIS — F1411 Cocaine abuse, in remission: Secondary | ICD-10-CM | POA: Insufficient documentation

## 2014-05-04 DIAGNOSIS — E119 Type 2 diabetes mellitus without complications: Secondary | ICD-10-CM | POA: Insufficient documentation

## 2014-05-04 DIAGNOSIS — I429 Cardiomyopathy, unspecified: Secondary | ICD-10-CM | POA: Insufficient documentation

## 2014-05-04 DIAGNOSIS — Z87891 Personal history of nicotine dependence: Secondary | ICD-10-CM | POA: Insufficient documentation

## 2014-05-04 DIAGNOSIS — I5020 Unspecified systolic (congestive) heart failure: Secondary | ICD-10-CM | POA: Diagnosis present

## 2014-05-04 DIAGNOSIS — I5022 Chronic systolic (congestive) heart failure: Secondary | ICD-10-CM | POA: Diagnosis not present

## 2014-05-04 DIAGNOSIS — F1410 Cocaine abuse, uncomplicated: Secondary | ICD-10-CM

## 2014-05-04 DIAGNOSIS — N183 Chronic kidney disease, stage 3 unspecified: Secondary | ICD-10-CM

## 2014-05-04 DIAGNOSIS — I129 Hypertensive chronic kidney disease with stage 1 through stage 4 chronic kidney disease, or unspecified chronic kidney disease: Secondary | ICD-10-CM | POA: Insufficient documentation

## 2014-05-04 DIAGNOSIS — I509 Heart failure, unspecified: Secondary | ICD-10-CM

## 2014-05-04 DIAGNOSIS — I10 Essential (primary) hypertension: Secondary | ICD-10-CM

## 2014-05-04 LAB — BASIC METABOLIC PANEL
ANION GAP: 13 (ref 5–15)
BUN: 26 mg/dL — ABNORMAL HIGH (ref 6–23)
CALCIUM: 8.8 mg/dL (ref 8.4–10.5)
CHLORIDE: 100 meq/L (ref 96–112)
CO2: 29 meq/L (ref 19–32)
CREATININE: 1.47 mg/dL — AB (ref 0.50–1.35)
GFR calc non Af Amer: 56 mL/min — ABNORMAL LOW (ref >90)
GFR, EST AFRICAN AMERICAN: 65 mL/min — AB (ref >90)
Glucose, Bld: 114 mg/dL — ABNORMAL HIGH (ref 70–99)
Potassium: 3.5 mEq/L — ABNORMAL LOW (ref 3.7–5.3)
SODIUM: 142 meq/L (ref 137–147)

## 2014-05-04 MED ORDER — ISOSORB DINITRATE-HYDRALAZINE 20-37.5 MG PO TABS: 2.0000 | ORAL_TABLET | Freq: Three times a day (TID) | ORAL | Status: DC

## 2014-05-04 NOTE — Patient Instructions (Signed)
Follow up in 2 weeks  Take Bidil 2 tablets three times a day  Do the following things EVERYDAY: 1) Weigh yourself in the morning before breakfast. Write it down and keep it in a log. 2) Take your medicines as prescribed 3) Eat low salt foods-Limit salt (sodium) to 2000 mg per day.  4) Stay as active as you can everyday 5) Limit all fluids for the day to less than 2 liters

## 2014-05-07 ENCOUNTER — Other Ambulatory Visit (HOSPITAL_COMMUNITY): Payer: Self-pay

## 2014-05-07 MED ORDER — LISINOPRIL 2.5 MG PO TABS: 2.5000 mg | ORAL_TABLET | Freq: Every day | ORAL | Status: DC

## 2014-05-18 ENCOUNTER — Inpatient Hospital Stay (HOSPITAL_COMMUNITY): Admission: RE | Admit: 2014-05-18 | Payer: PRIVATE HEALTH INSURANCE | Source: Ambulatory Visit

## 2014-05-28 ENCOUNTER — Telehealth (HOSPITAL_COMMUNITY): Payer: Self-pay | Admitting: Vascular Surgery

## 2014-05-28 ENCOUNTER — Institutional Professional Consult (permissible substitution): Payer: PRIVATE HEALTH INSURANCE | Admitting: Pulmonary Disease

## 2014-05-28 DIAGNOSIS — I509 Heart failure, unspecified: Secondary | ICD-10-CM

## 2014-05-28 MED ORDER — POTASSIUM CHLORIDE CRYS ER 20 MEQ PO TBCR: 40.0000 meq | EXTENDED_RELEASE_TABLET | Freq: Two times a day (BID) | ORAL | Status: DC

## 2014-05-28 MED ORDER — ISOSORB DINITRATE-HYDRALAZINE 20-37.5 MG PO TABS
2.0000 | ORAL_TABLET | Freq: Three times a day (TID) | ORAL | Status: DC
Start: 2014-05-28 — End: 2014-09-14

## 2014-05-28 MED ORDER — LISINOPRIL 2.5 MG PO TABS: 2.5000 mg | ORAL_TABLET | Freq: Every day | ORAL | Status: DC

## 2014-05-28 MED ORDER — CARVEDILOL 12.5 MG PO TABS: 18.7500 mg | ORAL_TABLET | Freq: Two times a day (BID) | ORAL | Status: DC

## 2014-05-28 MED ORDER — FUROSEMIDE 80 MG PO TABS: 80.0000 mg | ORAL_TABLET | Freq: Every day | ORAL | Status: DC

## 2014-05-28 NOTE — Telephone Encounter (Signed)
pt called he needs a refill on all his medicine

## 2014-05-28 NOTE — Telephone Encounter (Signed)
AS REQUESTED MED REFILLS SENT INTO PHARMACY NO ADDITIONAL REFILLS GIVEN AS PT IS DUE FOR FOLLOW UP WITH MED TITRATION PT SHOULD KEEP UPCOMING APPOINTMENT ON 06/02/14, FOR ADDITIONAL REFILLS AND OR MED ADJUSTMENTS

## 2014-05-29 ENCOUNTER — Telehealth (HOSPITAL_COMMUNITY): Payer: Self-pay | Admitting: *Deleted

## 2014-05-29 NOTE — Telephone Encounter (Signed)
Pt had been referred to [facility] and was scheduled to see Dr Marisue HumbleSanford 05/29/14 however received note from them that pt no showed for that appointment, pt also no showed for his last appointment with us on 11/9.  Message sent to Tonye BecketAmy Clegg, NP for [facility]

## 2014-06-08 ENCOUNTER — Inpatient Hospital Stay (HOSPITAL_COMMUNITY): Admission: RE | Admit: 2014-06-08 | Payer: PRIVATE HEALTH INSURANCE | Source: Ambulatory Visit

## 2014-08-26 ENCOUNTER — Telehealth (HOSPITAL_COMMUNITY): Payer: Self-pay | Admitting: Vascular Surgery

## 2014-08-26 DIAGNOSIS — I509 Heart failure, unspecified: Secondary | ICD-10-CM

## 2014-08-26 MED ORDER — FUROSEMIDE 80 MG PO TABS: 80.0000 mg | ORAL_TABLET | Freq: Every day | ORAL | Status: DC

## 2014-08-26 MED ORDER — POTASSIUM CHLORIDE CRYS ER 20 MEQ PO TBCR: 40.0000 meq | EXTENDED_RELEASE_TABLET | Freq: Two times a day (BID) | ORAL | Status: DC

## 2014-08-26 NOTE — Telephone Encounter (Signed)
Refill Potassium, Furosimide

## 2014-08-26 NOTE — Telephone Encounter (Signed)
Must keep office visits scheduled on 03/007/16 for further refills

## 2014-09-01 ENCOUNTER — Telehealth (HOSPITAL_COMMUNITY): Payer: Self-pay | Admitting: Vascular Surgery

## 2014-09-01 NOTE — Telephone Encounter (Signed)
Pt called about his medications he believes he not suppose to be taking the Furosemide anymore . He wants to talk to someone about his medications, he is not feeling well he believes it from the Furosemide

## 2014-09-01 NOTE — Telephone Encounter (Signed)
Reviewed medications with patient.  States he did not think he should be taking lasix anymore, and that he should be on lisinopril.  Per last note in October he was on lasix and not taking lisinopril.  Has appointment with us next week.  Strongly advised to bring medications to this appointment.  Aware and agreeable.  Ave FilterBradley, Megan Genevea

## 2014-09-14 ENCOUNTER — Ambulatory Visit (HOSPITAL_COMMUNITY)
Admission: RE | Admit: 2014-09-14 | Discharge: 2014-09-14 | Disposition: A | Discharge status: Home / Self Care | Payer: PRIVATE HEALTH INSURANCE | Source: Ambulatory Visit | Attending: Cardiology | Admitting: Cardiology

## 2014-09-14 ENCOUNTER — Encounter (HOSPITAL_COMMUNITY): Payer: Self-pay

## 2014-09-14 VITALS — BP 170/118 | HR 88 | Wt 249.5 lb

## 2014-09-14 DIAGNOSIS — I129 Hypertensive chronic kidney disease with stage 1 through stage 4 chronic kidney disease, or unspecified chronic kidney disease: Secondary | ICD-10-CM | POA: Insufficient documentation

## 2014-09-14 DIAGNOSIS — F1410 Cocaine abuse, uncomplicated: Secondary | ICD-10-CM | POA: Diagnosis not present

## 2014-09-14 DIAGNOSIS — I5022 Chronic systolic (congestive) heart failure: Secondary | ICD-10-CM

## 2014-09-14 DIAGNOSIS — Z87891 Personal history of nicotine dependence: Secondary | ICD-10-CM | POA: Insufficient documentation

## 2014-09-14 DIAGNOSIS — Z79899 Other long term (current) drug therapy: Secondary | ICD-10-CM | POA: Diagnosis not present

## 2014-09-14 DIAGNOSIS — E119 Type 2 diabetes mellitus without complications: Secondary | ICD-10-CM | POA: Insufficient documentation

## 2014-09-14 DIAGNOSIS — N183 Chronic kidney disease, stage 3 unspecified: Secondary | ICD-10-CM

## 2014-09-14 DIAGNOSIS — Z7982 Long term (current) use of aspirin: Secondary | ICD-10-CM | POA: Diagnosis not present

## 2014-09-14 DIAGNOSIS — I10 Essential (primary) hypertension: Secondary | ICD-10-CM

## 2014-09-14 DIAGNOSIS — I509 Heart failure, unspecified: Secondary | ICD-10-CM

## 2014-09-14 DIAGNOSIS — R0600 Dyspnea, unspecified: Secondary | ICD-10-CM

## 2014-09-14 LAB — BASIC METABOLIC PANEL
ANION GAP: 3 — AB (ref 5–15)
BUN: 18 mg/dL (ref 6–23)
CALCIUM: 9.2 mg/dL (ref 8.4–10.5)
CHLORIDE: 103 mmol/L (ref 96–112)
CO2: 31 mmol/L (ref 19–32)
Creatinine, Ser: 1.45 mg/dL — ABNORMAL HIGH (ref 0.50–1.35)
GFR calc non Af Amer: 56 mL/min — ABNORMAL LOW (ref >90)
GFR, EST AFRICAN AMERICAN: 65 mL/min — AB (ref >90)
Glucose, Bld: 123 mg/dL — ABNORMAL HIGH (ref 70–99)
Potassium: 3.8 mmol/L (ref 3.5–5.1)
Sodium: 137 mmol/L (ref 135–145)

## 2014-09-14 LAB — BRAIN NATRIURETIC PEPTIDE: B Natriuretic Peptide: 1173.2 pg/mL — ABNORMAL HIGH (ref 0.0–100.0)

## 2014-09-14 MED ORDER — LISINOPRIL 2.5 MG PO TABS: 2.5000 mg | ORAL_TABLET | Freq: Every day | ORAL | Status: DC

## 2014-09-14 MED ORDER — ISOSORB DINITRATE-HYDRALAZINE 20-37.5 MG PO TABS
2.0000 | ORAL_TABLET | Freq: Three times a day (TID) | ORAL | Status: DC
Start: 2014-09-14 — End: 2014-11-09

## 2014-09-14 NOTE — Patient Instructions (Signed)
RESTART Bidil 2 tablets 3 times daily.  Will schedule you for a split night sleep study.  Follow up 2 weeks.  Do the following things EVERYDAY: 1) Weigh yourself in the morning before breakfast. Write it down and keep it in a log. 2) Take your medicines as prescribed 3) Eat low salt foods-Limit salt (sodium) to 2000 mg per day.  4) Stay as active as you can everyday 5) Limit all fluids for the day to less than 2 liters

## 2014-09-14 NOTE — Progress Notes (Signed)
Patient ID: Derek FlemingJewel Chapman, male   DOB: 07/22/1965, 49 y.o.   MRN: [id number] HPI: Derek Chapman is a 49 year old with a history of hypertension, kidney stones, former cocaine abuse (last used 6 months ago) and marijuana use, tobacco abuse from 2003 to about 2006 and newly diagnosed systolic heart failure in August thought to be from HTN. He has not had ischemic work up. [facility]  With chest pain and increased dyspnea.He was  diuresed with IV lasix and transitioned to 80 mg lasix daily. He was started on carvedilol, bidil, and lasix. Discharge weight 246 pounds.   Derek Chapman returns for followup.  Since last appointment, he stopped his meds.  After that, he re-developed dyspnea and othopnea.  He restarted all his meds except Bidil and has been on all of them for the last 3 wks.  Dyspnea has improved.  He is still short of breath after walking 100 yards.  No orthopnea now.   No lightheadedness.  He has snoring and daytime sleepiness.  He was set up for nephrology appointment but never went. BP today 170/118.   Labs 03/06/14 K 3.9 Creatinine 1.61 Labs 03/03/14 Hgb 12.8  TSH 1.8 Pro BNP 6303 Hgb A1C 6.9 Labs 03/13/14 SPEP no M spikes. 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 Drinks a glass of wine 1-2 times per week. Has 5 children. Lives alone.    FH : Father HTN Cancer         Mom DM   ROS: All systems negative except as listed in HPI, PMH and Problem List.  Past Medical History  Diagnosis Date  . Kidney stone   . Hypertension   . CKD (chronic kidney disease), stage III   . Chronic systolic HF (heart failure)     a) ECHO (02/2014): EF 15%, grade II DD, mild Derek, RV mildly dilated and systolic fx mod reduced  . Cardiomyopathy     a) thought to be NICM r/t HTN b) Lexiscan Myoview (03/2014): fixed medium-sized mild  basal to mid inferior perfusion defect, EF 20%, doubt infarction, no ischemia    Current Outpatient Prescriptions  Medication Sig Dispense Refill  . aspirin 81 MG tablet Take 81 mg by mouth daily.    . carvedilol (COREG) 12.5 MG tablet Take 1.5 tablets (18.75 mg total) by mouth 2 (two) times daily with a meal. 90 tablet 0  . furosemide (LASIX) 80 MG tablet Take 1 tablet (80 mg total) by mouth daily. 30 tablet 0  . isosorbide-hydrALAZINE (BIDIL) 20-37.5 MG per tablet Take 2 tablets by mouth 3 (three) times daily. 180 tablet 3  . lisinopril (PRINIVIL,ZESTRIL) 2.5 MG tablet Take 1 tablet (2.5 mg total) by mouth daily. 30 tablet 2  . metFORMIN (GLUCOPHAGE) 500 MG tablet Take 1 tablet (500 mg total) by mouth daily with breakfast. 30 tablet 0  . potassium chloride SA (K-DUR,KLOR-CON) 20 MEQ tablet Take 2 tablets (40 mEq total) by mouth 2 (two) times daily. 60 tablet 0   No current facility-administered medications for this encounter.     PHYSICAL EXAM: Filed Vitals:   09/14/14 0956  BP: 170/118  Pulse: 88  Weight: 249 lb 8 oz (113.172 kg)  SpO2: 100%    General:  Well appearing. No resp difficulty. Ambulate in the clinic without  difficulty.  HEENT: normal Neck: Thick. JVP 5-6 . Carotids 2+ bilaterally; no bruits. No lymphadenopathy or thryomegaly appreciated. Cor: PMI normal. Regular rate & rhythm. No rubs, gallops or murmurs.  Lungs: clear Abdomen: soft, nontender, nondistended. No hepatosplenomegaly. No bruits or masses. Good bowel sounds. Extremities: no cyanosis, clubbing, rash, RLE LLE trace edema Neuro: alert & orientedx3, cranial nerves grossly intact. Moves all 4 extremities w/o difficulty. Affect pleasant.  ASSESSMENT & PLAN: 1. Chronic Systolic Heart Failure: ECHO EF 15% in 8/15. No cardiac cath due to CKD, Cardiolite in 9/15 showed no ischemia.  Probably nonischemic cardiomyopathy due to uncontrolled HTN.  NYHA class II symptoms.  Not volume overloaded on exam since he has  started back on Lasix.  - Continue Lasix 80 mg daily.  BMET/BNP today.  - Continue Coreg 18.75 mg bid and restart Bidil 2 tabs tid.  No Viagra use with Bidil.  - Repeat echo in 2 months after he is back on all meds.  If EF remains low, will need ICD (QRS narrow, not CRT candidate).  - He is not on ACEI or spironolactone due to CKD.  - I have stressed the importance of ongoing medication compliance.  2. HTN: BP up but off Bidil. Restart as above.  Return in 2 wks to followup on BP.  3. CKD: Creatinine 1.47 when last checked.  Suspect hypertensive nephropathy.  He missed his nephrology appt.  BMET today, will try to reschedule if creatinine is significantly higher than prior.   4. Diabetes: On metformin, needs to followup with a PCP.  5. Former Smoker Quit 2006 6. Former Cocaine Abuse.  7. Suspect OSA: I will try to schedule a sleep study today.   Marca Ancona 09/14/2014

## 2014-09-29 ENCOUNTER — Encounter (HOSPITAL_COMMUNITY): Payer: PRIVATE HEALTH INSURANCE

## 2014-10-09 ENCOUNTER — Telehealth: Payer: Self-pay | Admitting: Cardiology

## 2014-10-09 ENCOUNTER — Other Ambulatory Visit: Payer: Self-pay | Admitting: Cardiology

## 2014-10-09 DIAGNOSIS — I509 Heart failure, unspecified: Secondary | ICD-10-CM

## 2014-10-09 DIAGNOSIS — I5020 Unspecified systolic (congestive) heart failure: Secondary | ICD-10-CM

## 2014-10-09 MED ORDER — FUROSEMIDE 80 MG PO TABS: 80.0000 mg | ORAL_TABLET | Freq: Every day | ORAL | Status: DC

## 2014-10-09 NOTE — Telephone Encounter (Signed)
Pt called out of lasix, refilled 80 mg one daily, #30- to be seen in clinic on the 7th.

## 2014-11-02 ENCOUNTER — Telehealth (HOSPITAL_COMMUNITY): Payer: Self-pay | Admitting: Vascular Surgery

## 2014-11-02 DIAGNOSIS — I509 Heart failure, unspecified: Secondary | ICD-10-CM

## 2014-11-02 NOTE — Telephone Encounter (Signed)
Pt called he needs refill Potassium, Foresimide , lisinpril , Carvedilol

## 2014-11-03 MED ORDER — FUROSEMIDE 80 MG PO TABS: 80.0000 mg | ORAL_TABLET | Freq: Every day | ORAL | Status: DC

## 2014-11-03 MED ORDER — POTASSIUM CHLORIDE CRYS ER 20 MEQ PO TBCR
40.0000 meq | EXTENDED_RELEASE_TABLET | Freq: Two times a day (BID) | ORAL | Status: DC
Start: 2014-11-03 — End: 2014-11-24

## 2014-11-03 MED ORDER — CARVEDILOL 12.5 MG PO TABS: 18.7500 mg | ORAL_TABLET | Freq: Two times a day (BID) | ORAL | Status: DC

## 2014-11-03 MED ORDER — LISINOPRIL 2.5 MG PO TABS: 2.5000 mg | ORAL_TABLET | Freq: Every day | ORAL | Status: DC

## 2014-11-03 NOTE — Telephone Encounter (Signed)
REFILLS ADDRESSES HOWEVER PT WILL NEED OFFICE VISIST BEFORE FURTHER REFILLS WILL BE GIVEN

## 2014-11-09 ENCOUNTER — Encounter (HOSPITAL_COMMUNITY): Payer: Self-pay

## 2014-11-09 ENCOUNTER — Ambulatory Visit (HOSPITAL_COMMUNITY)
Admission: RE | Admit: 2014-11-09 | Discharge: 2014-11-09 | Disposition: A | Discharge status: Home / Self Care | Payer: PRIVATE HEALTH INSURANCE | Source: Ambulatory Visit | Attending: Internal Medicine | Admitting: Internal Medicine

## 2014-11-09 VITALS — BP 180/122 | HR 94 | Wt 254.0 lb

## 2014-11-09 DIAGNOSIS — I129 Hypertensive chronic kidney disease with stage 1 through stage 4 chronic kidney disease, or unspecified chronic kidney disease: Secondary | ICD-10-CM | POA: Insufficient documentation

## 2014-11-09 DIAGNOSIS — I509 Heart failure, unspecified: Secondary | ICD-10-CM

## 2014-11-09 DIAGNOSIS — I10 Essential (primary) hypertension: Secondary | ICD-10-CM

## 2014-11-09 DIAGNOSIS — Z79899 Other long term (current) drug therapy: Secondary | ICD-10-CM | POA: Insufficient documentation

## 2014-11-09 DIAGNOSIS — Z7982 Long term (current) use of aspirin: Secondary | ICD-10-CM | POA: Diagnosis not present

## 2014-11-09 DIAGNOSIS — E8770 Fluid overload, unspecified: Secondary | ICD-10-CM | POA: Diagnosis not present

## 2014-11-09 DIAGNOSIS — I5022 Chronic systolic (congestive) heart failure: Secondary | ICD-10-CM | POA: Diagnosis not present

## 2014-11-09 DIAGNOSIS — N183 Chronic kidney disease, stage 3 unspecified: Secondary | ICD-10-CM

## 2014-11-09 DIAGNOSIS — E119 Type 2 diabetes mellitus without complications: Secondary | ICD-10-CM | POA: Diagnosis not present

## 2014-11-09 DIAGNOSIS — Z87891 Personal history of nicotine dependence: Secondary | ICD-10-CM | POA: Diagnosis not present

## 2014-11-09 DIAGNOSIS — I429 Cardiomyopathy, unspecified: Secondary | ICD-10-CM | POA: Insufficient documentation

## 2014-11-09 LAB — BASIC METABOLIC PANEL
ANION GAP: 13 (ref 5–15)
BUN: 21 mg/dL — ABNORMAL HIGH (ref 6–20)
CALCIUM: 9.3 mg/dL (ref 8.9–10.3)
CO2: 24 mmol/L (ref 22–32)
Chloride: 103 mmol/L (ref 101–111)
Creatinine, Ser: 1.4 mg/dL — ABNORMAL HIGH (ref 0.61–1.24)
GFR, EST NON AFRICAN AMERICAN: 59 mL/min — AB (ref >60)
Glucose, Bld: 117 mg/dL — ABNORMAL HIGH (ref 70–99)
Potassium: 3.9 mmol/L (ref 3.5–5.1)
Sodium: 140 mmol/L (ref 135–145)

## 2014-11-09 LAB — BRAIN NATRIURETIC PEPTIDE: B NATRIURETIC PEPTIDE 5: 1377.8 pg/mL — AB (ref 0.0–100.0)

## 2014-11-09 MED ORDER — FUROSEMIDE 80 MG PO TABS: ORAL_TABLET | ORAL | Status: DC

## 2014-11-09 MED ORDER — LISINOPRIL 5 MG PO TABS: 5.0000 mg | ORAL_TABLET | Freq: Every day | ORAL | Status: DC

## 2014-11-09 MED ORDER — CARVEDILOL 25 MG PO TABS: 25.0000 mg | ORAL_TABLET | Freq: Two times a day (BID) | ORAL | Status: DC

## 2014-11-09 MED ORDER — AMLODIPINE BESYLATE 5 MG PO TABS: 5.0000 mg | ORAL_TABLET | Freq: Every day | ORAL | Status: DC

## 2014-11-09 MED ORDER — SPIRONOLACTONE 25 MG PO TABS: 12.5000 mg | ORAL_TABLET | Freq: Every day | ORAL | Status: DC

## 2014-11-09 NOTE — Progress Notes (Signed)
Patient ID: Derek Chapman, male   DOB: 04/06/1966, 49 y.o.   MRN: [id number] HPI: Derek Chapman is a 49 year old with a history of hypertension, kidney stones, former cocaine abuse and marijuana abuse, tobacco abuse from 2003 to about 2006 and newly diagnosed systolic heart failure in August 2015 thought to be from HTN. [facility] with chest pain and increased dyspnea.  He was  diuresed with IV lasix and transitioned to 80 mg lasix daily. He was started on carvedilol, bidil, and lasix. Discharge weight 246 pounds.   Derek Chapman returns for followup.  He is now taking all his meds except Bidil.  He cannot tolerate Bidil due to headaches.  His BP remains very high today.  He has gained 5 lbs since last appointment.  He is short of breath after walking 30-40 feet or going up steps.  This has worsened.  No chest pain.  No lightheadedness, palpitations, syncope.  +mild orthopnea.  He snores and has daytime sleepiness, sleep study has been scheduled.   Labs 03/06/14 K 3.9 Creatinine 1.61 Labs 03/03/14 Hgb 12.8  TSH 1.8 Pro BNP 6303 Hgb A1C 6.9 Labs 03/13/14 SPEP no M spikes. 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 Drinks a glass of wine 1-2 times per week. Has 5 children. Lives alone.    FH : Father HTN Cancer         Mom DM   ROS: All systems negative except as listed in HPI, PMH and Problem List.  Past Medical History  Diagnosis Date  . Kidney stone   . Hypertension   . CKD (chronic kidney disease), stage III   . Chronic systolic HF (heart failure)     a) ECHO (02/2014): EF 15%, grade II DD, mild Derek, RV mildly dilated and systolic fx mod reduced  . Cardiomyopathy     a) thought to be NICM r/t HTN b) Lexiscan Myoview (03/2014): fixed medium-sized mild basal to mid  inferior perfusion defect, EF 20%, doubt infarction, no ischemia    Current Outpatient Prescriptions  Medication Sig Dispense Refill  . aspirin 81 MG tablet Take 81 mg by mouth daily.    . carvedilol (COREG) 25 MG tablet Take 1 tablet (25 mg total) by mouth 2 (two) times daily with a meal. 60 tablet 3  . furosemide (LASIX) 80 MG tablet Please take 80 mg for 3 days-- then change to 80 mg in am and 40 mg in pm 30 tablet 0  . lisinopril (PRINIVIL,ZESTRIL) 5 MG tablet Take 1 tablet (5 mg total) by mouth daily. 30 tablet 3  . metFORMIN (GLUCOPHAGE) 500 MG tablet Take 1 tablet (500 mg total) by mouth daily with breakfast. 30 tablet 0  . potassium chloride SA (K-DUR,KLOR-CON) 20 MEQ tablet Take 2 tablets (40 mEq total) by mouth 2 (two) times daily. 60 tablet 0  . amLODipine (NORVASC) 5 MG tablet Take 1 tablet (5 mg total) by mouth daily. 180 tablet 3  . spironolactone (ALDACTONE) 25 MG tablet Take 0.5 tablets (12.5 mg total) by mouth daily. 90 tablet 3   No current facility-administered medications for this encounter.     PHYSICAL EXAM: Filed Vitals:   11/09/14 0853  BP: 180/122  Pulse: 94  Weight: 254  lb (115.214 kg)  SpO2: 100%    General:  Well appearing. No resp difficulty. Ambulate in the clinic without difficulty.  HEENT: normal Neck: Thick. JVP 12 cm. Carotids 2+ bilaterally; no bruits. No lymphadenopathy or thryomegaly appreciated. Cor: PMI normal. Regular rate & rhythm. No rubs, murmurs. +S4.  Lungs: crackles at bases bilaterally.  Abdomen: soft, nontender, nondistended. No hepatosplenomegaly. No bruits or masses. Good bowel sounds. Extremities: no cyanosis, clubbing, rash, 1+ edema 1/2 up lower legs bilaterally.  Neuro: alert & orientedx3, cranial nerves grossly intact. Moves all 4 extremities w/o difficulty. Affect pleasant.  ASSESSMENT & PLAN: 1. Chronic Systolic Heart Failure: ECHO EF 15% in 8/15. No cardiac cath due to CKD, Cardiolite in 9/15 showed no ischemia.  Probably  nonischemic cardiomyopathy due to uncontrolled HTN.  NYHA class III symptoms, worsened.  Weight is up and he is volume overloaded on exam.  - Increase Lasix to 80 mg bid x 3 days then 80 qam/40 qpm.  BMET/BNP today and repeat in 2 wks.   - He is off Bidil, suspect he is not going to be able to take this medication due to headache.  - I will arrange for echo after we get his BP controlled.  If EF remains low, will need ICD (QRS narrow, not CRT candidate).  - Increase lisinopril to 5 mg daily and add spironolactone 12.5 mg daily.  As above, follow K and creatinine closely.   - I have stressed the importance of ongoing medication compliance.  - Needs to follow low sodium diet.  2. HTN: BP up but off Bidil. In addition to the above steps, I will add amlodipine 5 mg daily.  It is critical to get his BP under control as I think this may improve his cardiac function. .  3. CKD: Creatinine stable at 1.45 when last checked.  Suspect hypertensive nephropathy.   4. Diabetes: On metformin, needs to followup with a PCP.  5. Former Smoker Quit 2006 6. Former Cocaine Abuse.  7. Suspect OSA:  Sleep study has been scheduled.   Followup in office in 2 wks.    Marca Ancona 11/09/2014

## 2014-11-09 NOTE — Patient Instructions (Signed)
Please begin Lasix 80 mg twice daily for 3 days and then change to 80 mg in am and 40 mg in pm Begin Spirolactone 12.5 mg daily Increase Lisinopril 5 mg daily Increase Coreg to 25 mg twice daily Stop Bidil Begin Amlodipine 5 mg daily Labs today and follow-up in 2 weeks

## 2014-11-19 ENCOUNTER — Ambulatory Visit (HOSPITAL_BASED_OUTPATIENT_CLINIC_OR_DEPARTMENT_OTHER): Payer: PRIVATE HEALTH INSURANCE | Attending: Cardiology

## 2014-11-24 ENCOUNTER — Ambulatory Visit (HOSPITAL_COMMUNITY)
Admission: RE | Admit: 2014-11-24 | Discharge: 2014-11-24 | Disposition: A | Discharge status: Home / Self Care | Payer: PRIVATE HEALTH INSURANCE | Source: Ambulatory Visit | Attending: Internal Medicine | Admitting: Internal Medicine

## 2014-11-24 VITALS — BP 146/98 | HR 84 | Wt 250.5 lb

## 2014-11-24 DIAGNOSIS — Z7982 Long term (current) use of aspirin: Secondary | ICD-10-CM | POA: Insufficient documentation

## 2014-11-24 DIAGNOSIS — I10 Essential (primary) hypertension: Secondary | ICD-10-CM | POA: Diagnosis not present

## 2014-11-24 DIAGNOSIS — I129 Hypertensive chronic kidney disease with stage 1 through stage 4 chronic kidney disease, or unspecified chronic kidney disease: Secondary | ICD-10-CM | POA: Diagnosis not present

## 2014-11-24 DIAGNOSIS — Z833 Family history of diabetes mellitus: Secondary | ICD-10-CM | POA: Diagnosis not present

## 2014-11-24 DIAGNOSIS — F1410 Cocaine abuse, uncomplicated: Secondary | ICD-10-CM | POA: Diagnosis not present

## 2014-11-24 DIAGNOSIS — Z79899 Other long term (current) drug therapy: Secondary | ICD-10-CM | POA: Insufficient documentation

## 2014-11-24 DIAGNOSIS — N183 Chronic kidney disease, stage 3 unspecified: Secondary | ICD-10-CM

## 2014-11-24 DIAGNOSIS — I5022 Chronic systolic (congestive) heart failure: Secondary | ICD-10-CM | POA: Insufficient documentation

## 2014-11-24 DIAGNOSIS — E119 Type 2 diabetes mellitus without complications: Secondary | ICD-10-CM | POA: Diagnosis not present

## 2014-11-24 DIAGNOSIS — I429 Cardiomyopathy, unspecified: Secondary | ICD-10-CM | POA: Diagnosis not present

## 2014-11-24 DIAGNOSIS — Z8249 Family history of ischemic heart disease and other diseases of the circulatory system: Secondary | ICD-10-CM | POA: Diagnosis not present

## 2014-11-24 DIAGNOSIS — I509 Heart failure, unspecified: Secondary | ICD-10-CM | POA: Diagnosis not present

## 2014-11-24 DIAGNOSIS — Z87891 Personal history of nicotine dependence: Secondary | ICD-10-CM | POA: Diagnosis not present

## 2014-11-24 LAB — BRAIN NATRIURETIC PEPTIDE: B Natriuretic Peptide: 250.1 pg/mL — ABNORMAL HIGH (ref 0.0–100.0)

## 2014-11-24 LAB — BASIC METABOLIC PANEL
Anion gap: 10 (ref 5–15)
BUN: 25 mg/dL — ABNORMAL HIGH (ref 6–20)
CALCIUM: 9.4 mg/dL (ref 8.9–10.3)
CO2: 25 mmol/L (ref 22–32)
Chloride: 102 mmol/L (ref 101–111)
Creatinine, Ser: 1.28 mg/dL — ABNORMAL HIGH (ref 0.61–1.24)
GFR calc Af Amer: >60 mL/min (ref >60)
GFR calc non Af Amer: >60 mL/min (ref >60)
Glucose, Bld: 131 mg/dL — ABNORMAL HIGH (ref 65–99)
Potassium: 4.1 mmol/L (ref 3.5–5.1)
SODIUM: 137 mmol/L (ref 135–145)

## 2014-11-24 MED ORDER — LISINOPRIL 10 MG PO TABS: 10.0000 mg | ORAL_TABLET | Freq: Every day | ORAL | Status: DC

## 2014-11-24 MED ORDER — SPIRONOLACTONE 25 MG PO TABS: 25.0000 mg | ORAL_TABLET | Freq: Every day | ORAL | Status: DC

## 2014-11-24 MED ORDER — POTASSIUM CHLORIDE CRYS ER 20 MEQ PO TBCR: 40.0000 meq | EXTENDED_RELEASE_TABLET | Freq: Every day | ORAL | Status: DC

## 2014-11-24 MED ORDER — FUROSEMIDE 80 MG PO TABS: 80.0000 mg | ORAL_TABLET | Freq: Every day | ORAL | Status: DC

## 2014-11-24 NOTE — Patient Instructions (Signed)
Increase Lisinopril to 10 mg daily  Increase Spironolactone to 25 mg daily  Decrease Potassium to 40 meq (2 tabs) daily  IF weight is 254 lb or greater INCREASE Furosemide to 80 mg in AM and 40 mg in PM for 3 DAYS, when you do this please take an extra 40 meq of potassium  Your physician has recommended that you have a sleep study. This test records several body functions during sleep, including: brain activity, eye movement, oxygen and carbon dioxide blood levels, heart rate and rhythm, breathing rate and rhythm, the flow of air through your mouth and nose, snoring, body muscle movements, and chest and belly movement.  Labs today  Labs in 2 weeks  Your physician recommends that you schedule a follow-up appointment in: 1 month

## 2014-11-24 NOTE — Progress Notes (Signed)
Patient ID: Odessa FlemingJewel Berberian, male   DOB: 12/22/1965, 49 y.o.   MRN: [id number] HPI: Mr Jackquline BoschHester is a 49 year old with a history of hypertension, kidney stones, former cocaine abuse and marijuana abuse, tobacco abuse from 2003 to about 2006 and newly diagnosed systolic heart failure in August 2015 thought to be from HTN. [facility], 2014 with chest pain and increased dyspnea.  He was  diuresed with IV lasix and transitioned to 80 mg lasix daily. He was started on carvedilol, bidil, and lasix. Discharge weight 246 pounds.   At last appointment, Mr Jackquline BoschHester was volume overloaded with elevated BP.  I increased his Lasix to 80 mg qam/40 qpm and adjusted his BP regimen.  His weight is down 4 lbs.  He feels much better.  He has gone back to Lasix 80 mg daily.  No dyspnea now.  He is able to climb steps without problems.  Going to the gym.  SBP in 140s now which is improved.    Labs 03/06/14 K 3.9 Creatinine 1.61 Labs 03/03/14 Hgb 12.8  TSH 1.8 Pro BNP 6303 Hgb A1C 6.9 Labs 03/13/14 SPEP no M spikes. 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 Former Cocaine Abuse. Drinks a glass of wine 1-2 times per week. Has 5 children. Lives alone.    FH : Father HTN Cancer         Mom DM   ROS: All systems negative except as listed in HPI, PMH and Problem List.  Past Medical History  Diagnosis Date  . Kidney stone   . Hypertension   . CKD (chronic kidney disease), stage III   . Chronic systolic HF (heart failure)     a) ECHO (02/2014): EF 15%, grade II DD, mild MR, RV mildly dilated and systolic fx mod reduced  . Cardiomyopathy     a) thought to be NICM r/t HTN b) Lexiscan Myoview (03/2014): fixed medium-sized mild basal to mid inferior perfusion defect, EF 20%, doubt  infarction, no ischemia    Current Outpatient Prescriptions  Medication Sig Dispense Refill  . amLODipine (NORVASC) 5 MG tablet Take 1 tablet (5 mg total) by mouth daily. 180 tablet 3  . aspirin 81 MG tablet Take 81 mg by mouth daily.    . carvedilol (COREG) 25 MG tablet Take 1 tablet (25 mg total) by mouth 2 (two) times daily with a meal. 60 tablet 3  . furosemide (LASIX) 80 MG tablet Take 1 tablet (80 mg total) by mouth daily. If weight is 254 lb or greater take 80 mg in AM and 40 mg in PM for 3 days    . lisinopril (PRINIVIL,ZESTRIL) 10 MG tablet Take 1 tablet (10 mg total) by mouth daily. 30 tablet 6  . metFORMIN (GLUCOPHAGE) 500 MG tablet Take 1 tablet (500 mg total) by mouth daily with breakfast. 30 tablet 0  . potassium chloride SA (K-DUR,KLOR-CON) 20 MEQ tablet Take 2 tablets (40 mEq total) by mouth daily. Take an extra 2 tabs when you take extra Furosemide 60 tablet 6  . spironolactone (ALDACTONE) 25 MG tablet Take 1 tablet (25 mg total) by mouth daily. 30 tablet 6   No current facility-administered medications for this encounter.  PHYSICAL EXAM: Filed Vitals:   11/24/14 0923  BP: 146/98  Pulse: 84  Weight: 250 lb 8 oz (113.626 kg)  SpO2: 98%    General:  Well appearing. No resp difficulty. Ambulate in the clinic without difficulty.  HEENT: normal Neck: Thick. JVP not elevated. Carotids 2+ bilaterally; no bruits. No lymphadenopathy or thryomegaly appreciated. Cor: PMI normal. Regular rate & rhythm. No rubs, murmurs. +S4.  Lungs: crackles at bases bilaterally.  Abdomen: soft, nontender, nondistended. No hepatosplenomegaly. No bruits or masses. Good bowel sounds. Extremities: no cyanosis, clubbing, rash, trace ankle edema.  Neuro: alert & orientedx3, cranial nerves grossly intact. Moves all 4 extremities w/o difficulty. Affect pleasant.  ASSESSMENT & PLAN: 1. Chronic Systolic Heart Failure: ECHO EF 15% in 8/15. No cardiac cath due to CKD, Cardiolite in 9/15 showed no  ischemia.  Probably nonischemic cardiomyopathy due to uncontrolled HTN.  NYHA class II symptoms, improved with diuresis and BP control. - Continue Lasix 80 mg daily.  If weight reaches 254 lbs, he will take Lasix 80 qam/40 qpm x 3 days.   - He is off Bidil, suspect he is not going to be able to take this medication due to headache.  - I will arrange for echo after we get his BP controlled.  If EF remains low, will need ICD (QRS narrow, not CRT candidate).  - Increase lisinopril to 10 mg daily and spironolactone to 25 mg daily, BMET today and repeat in 2 wks.  Decrease KCl to 40 daily.   - I have stressed the importance of ongoing medication compliance.  - Needs to follow low sodium diet.  2. HTN: BP better, adjust meds as above. It is critical to get his BP under control as I think this may improve his cardiac function.   3. CKD: Creatinine stable at 1.4 when last checked.  Suspect hypertensive nephropathy.  BMET today.  4. Diabetes: On metformin, needs to followup with a PCP.  5. Former Smoker Quit 2006 6. Former Cocaine Abuse.  7. Suspect OSA:  Missed his sleep study, will reschedule.   Followup in office in 1 month  Marca AnconaDalton Nocole Zammit 11/24/2014

## 2014-11-26 ENCOUNTER — Telehealth (HOSPITAL_COMMUNITY): Payer: Self-pay | Admitting: Cardiology

## 2014-11-26 NOTE — Telephone Encounter (Signed)
Pt to be scheduled for outpatient sleep study cpt [id number] With pts current insurance-[employer] NO PRE CERT REQUIRED per physician relations with [employer]

## 2014-12-09 ENCOUNTER — Other Ambulatory Visit (HOSPITAL_COMMUNITY): Payer: PRIVATE HEALTH INSURANCE

## 2014-12-25 ENCOUNTER — Encounter (HOSPITAL_COMMUNITY): Payer: PRIVATE HEALTH INSURANCE

## 2015-01-04 ENCOUNTER — Encounter (HOSPITAL_BASED_OUTPATIENT_CLINIC_OR_DEPARTMENT_OTHER): Payer: PRIVATE HEALTH INSURANCE

## 2015-01-06 ENCOUNTER — Other Ambulatory Visit (HOSPITAL_COMMUNITY): Payer: Self-pay | Admitting: Cardiology

## 2015-01-12 ENCOUNTER — Telehealth (HOSPITAL_COMMUNITY): Payer: Self-pay | Admitting: Cardiology

## 2015-01-12 NOTE — Telephone Encounter (Signed)
[facility] CALL-VOICEMAIL Patient message requesting refill on furosemide  Reviewed patients chart, this med refill was already addressed on 01/07/15, E-Rx'd and confirmed to pharmacy  Left patient a detailed message, advised patient to call if he has any problems getting this refill and in the future pt should contact pharmacy for refills

## 2015-02-01 ENCOUNTER — Encounter (HOSPITAL_BASED_OUTPATIENT_CLINIC_OR_DEPARTMENT_OTHER): Payer: PRIVATE HEALTH INSURANCE

## 2015-02-01 ENCOUNTER — Encounter (HOSPITAL_COMMUNITY): Payer: Self-pay | Admitting: Internal Medicine

## 2015-02-01 NOTE — Progress Notes (Signed)
05/28/14 pt no showed to see Dr Vassie Loll for sleep consult 11/19/14 pt no showed for sleep study 01/04/15 pt cancelled sleep study 02/01/15 pt cancelled sleep study again

## 2015-02-22 ENCOUNTER — Other Ambulatory Visit (HOSPITAL_COMMUNITY): Payer: Self-pay | Admitting: *Deleted

## 2015-04-07 ENCOUNTER — Other Ambulatory Visit (HOSPITAL_COMMUNITY): Payer: Self-pay | Admitting: *Deleted

## 2015-04-07 DIAGNOSIS — I509 Heart failure, unspecified: Secondary | ICD-10-CM

## 2015-04-07 MED ORDER — FUROSEMIDE 80 MG PO TABS: 80.0000 mg | ORAL_TABLET | Freq: Every day | ORAL | Status: DC

## 2015-04-07 MED ORDER — CARVEDILOL 25 MG PO TABS: 25.0000 mg | ORAL_TABLET | Freq: Two times a day (BID) | ORAL | Status: DC

## 2015-04-07 MED ORDER — AMLODIPINE BESYLATE 5 MG PO TABS: 5.0000 mg | ORAL_TABLET | Freq: Every day | ORAL | Status: DC

## 2015-04-07 NOTE — Telephone Encounter (Signed)
Pt called for refills  Gave refills for a month supply Pt states that he has not taken his spironolactone in a month Will get him scheduled to see DM Pt also said that he thought he didn't need the sleep study.

## 2015-04-26 ENCOUNTER — Ambulatory Visit (HOSPITAL_COMMUNITY)
Admission: RE | Admit: 2015-04-26 | Discharge: 2015-04-26 | Disposition: A | Discharge status: Home / Self Care | Payer: PRIVATE HEALTH INSURANCE | Source: Ambulatory Visit | Attending: Cardiology | Admitting: Cardiology

## 2015-04-26 VITALS — BP 144/94 | HR 74 | Ht 72.0 in | Wt 259.0 lb

## 2015-04-26 DIAGNOSIS — Z87891 Personal history of nicotine dependence: Secondary | ICD-10-CM | POA: Insufficient documentation

## 2015-04-26 DIAGNOSIS — Z8249 Family history of ischemic heart disease and other diseases of the circulatory system: Secondary | ICD-10-CM | POA: Diagnosis not present

## 2015-04-26 DIAGNOSIS — I129 Hypertensive chronic kidney disease with stage 1 through stage 4 chronic kidney disease, or unspecified chronic kidney disease: Secondary | ICD-10-CM | POA: Insufficient documentation

## 2015-04-26 DIAGNOSIS — Z833 Family history of diabetes mellitus: Secondary | ICD-10-CM | POA: Insufficient documentation

## 2015-04-26 DIAGNOSIS — N529 Male erectile dysfunction, unspecified: Secondary | ICD-10-CM | POA: Insufficient documentation

## 2015-04-26 DIAGNOSIS — Z6835 Body mass index (BMI) 35.0-35.9, adult: Secondary | ICD-10-CM | POA: Diagnosis not present

## 2015-04-26 DIAGNOSIS — N189 Chronic kidney disease, unspecified: Secondary | ICD-10-CM | POA: Diagnosis not present

## 2015-04-26 DIAGNOSIS — I5022 Chronic systolic (congestive) heart failure: Secondary | ICD-10-CM | POA: Diagnosis not present

## 2015-04-26 DIAGNOSIS — Z79899 Other long term (current) drug therapy: Secondary | ICD-10-CM | POA: Insufficient documentation

## 2015-04-26 DIAGNOSIS — I429 Cardiomyopathy, unspecified: Secondary | ICD-10-CM | POA: Insufficient documentation

## 2015-04-26 DIAGNOSIS — Z7984 Long term (current) use of oral hypoglycemic drugs: Secondary | ICD-10-CM | POA: Insufficient documentation

## 2015-04-26 DIAGNOSIS — E1122 Type 2 diabetes mellitus with diabetic chronic kidney disease: Secondary | ICD-10-CM | POA: Insufficient documentation

## 2015-04-26 DIAGNOSIS — I509 Heart failure, unspecified: Secondary | ICD-10-CM | POA: Diagnosis not present

## 2015-04-26 DIAGNOSIS — E669 Obesity, unspecified: Secondary | ICD-10-CM | POA: Insufficient documentation

## 2015-04-26 DIAGNOSIS — I10 Essential (primary) hypertension: Secondary | ICD-10-CM | POA: Diagnosis not present

## 2015-04-26 DIAGNOSIS — Z7982 Long term (current) use of aspirin: Secondary | ICD-10-CM | POA: Diagnosis not present

## 2015-04-26 LAB — BASIC METABOLIC PANEL WITH GFR
Anion gap: 10 (ref 5–15)
BUN: 17 mg/dL (ref 6–20)
CO2: 28 mmol/L (ref 22–32)
Calcium: 9.8 mg/dL (ref 8.9–10.3)
Chloride: 100 mmol/L — ABNORMAL LOW (ref 101–111)
Creatinine, Ser: 1.49 mg/dL — ABNORMAL HIGH (ref 0.61–1.24)
GFR calc Af Amer: >60 mL/min
GFR calc non Af Amer: 55 mL/min — ABNORMAL LOW
Glucose, Bld: 106 mg/dL — ABNORMAL HIGH (ref 65–99)
Potassium: 4 mmol/L (ref 3.5–5.1)
Sodium: 138 mmol/L (ref 135–145)

## 2015-04-26 LAB — LIPID PANEL
Cholesterol: 163 mg/dL (ref 0–200)
HDL: 53 mg/dL (ref >40)
LDL Cholesterol: 100 mg/dL — ABNORMAL HIGH (ref 0–99)
TRIGLYCERIDES: 51 mg/dL (ref <150)
Total CHOL/HDL Ratio: 3.1 RATIO
VLDL: 10 mg/dL (ref 0–40)

## 2015-04-26 LAB — BRAIN NATRIURETIC PEPTIDE: B NATRIURETIC PEPTIDE 5: 146 pg/mL — AB (ref 0.0–100.0)

## 2015-04-26 MED ORDER — LISINOPRIL 20 MG PO TABS: 10.0000 mg | ORAL_TABLET | Freq: Every day | ORAL | Status: DC

## 2015-04-26 MED ORDER — SILDENAFIL CITRATE 50 MG PO TABS: 50.0000 mg | ORAL_TABLET | Freq: Every day | ORAL | Status: DC | PRN

## 2015-04-26 MED ORDER — CARVEDILOL 25 MG PO TABS: 25.0000 mg | ORAL_TABLET | Freq: Two times a day (BID) | ORAL | Status: DC

## 2015-04-26 NOTE — Patient Instructions (Signed)
INCREASE Coreg to 25mg  (1 tablet) twice day.  INCREASE Lisinopril to 20mg  (1 tablet) daily.  Routine lab work today. Will notify you of abnormal results  ECHO and REPEAT LABS 2 weeks.  FOLLOW UP 3 months.

## 2015-04-27 LAB — HEMOGLOBIN A1C
Hgb A1c MFr Bld: 6.5 % — ABNORMAL HIGH (ref 4.8–5.6)
Mean Plasma Glucose: 140 mg/dL

## 2015-04-27 NOTE — Progress Notes (Signed)
Patient ID: Derek Chapman, male   DOB: 09/27/1965, 49 y.o.   MRN: [id number] HPI: Mr Lumpkin is a 49 year old with a history of hypertension, kidney stones, former cocaine abuse and marijuana abuse, tobacco abuse from 2003 to about 2006 and newly diagnosed systolic heart failure in August 2015 thought to be from HTN. Admitted to [facility] March 03, 2014 with chest pain and increased dyspnea.  He was  diuresed with IV lasix and transitioned to 80 mg lasix daily. He was started on carvedilol, Bidil, and lasix. Discharge weight 246 pounds.   He seems to be doing well today.  Working full time, active at work.  No exertional dyspnea, no chest pain, no orthopnea/PND.  Overall improved symptomatically.  Has been gaining weight, eating poorly and not getting regular exercise.  Only taking Coreg once a day.  BP today mildly elevated but better than in the past. He says SBP runs in 130s when he checks.   Labs 03/06/14 K 3.9 Creatinine 1.61 Labs 03/03/14 Hgb 12.8  TSH 1.8 Pro BNP 6303 Hgb A1C 6.9 Labs 03/13/14 SPEP no M spikes. 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 Drinks a glass of wine 1-2 times per week. Has 5 children. Lives alone.    FH : Father HTN Cancer         Mom DM   ROS: All systems negative except as listed in HPI, PMH and Problem List.  Past Medical History  Diagnosis Date  . Kidney stone   . Hypertension   . CKD (chronic kidney disease), stage III   . Chronic systolic HF (heart failure)     a) ECHO (02/2014): EF 15%, grade II DD, mild MR, RV mildly dilated and systolic fx mod reduced  . Cardiomyopathy     a) thought to be NICM r/t HTN b) Lexiscan Myoview (03/2014): fixed medium-sized mild basal to mid inferior  perfusion defect, EF 20%, doubt infarction, no ischemia    Current Outpatient Prescriptions  Medication Sig Dispense Refill  . amLODipine (NORVASC) 5 MG tablet Take 1 tablet (5 mg total) by mouth daily. 180 tablet 3  . aspirin 81 MG tablet Take 81 mg by mouth daily.    . carvedilol (COREG) 25 MG tablet Take 1 tablet (25 mg total) by mouth 2 (two) times daily with a meal. 60 tablet 3  . furosemide (LASIX) 80 MG tablet Take 1 tablet (80 mg total) by mouth daily. If weight is 254 lb or greater take 80 mg in AM and 40 mg in PM for 3 days 30 tablet 0  . lisinopril (PRINIVIL,ZESTRIL) 20 MG tablet Take 0.5 tablets (10 mg total) by mouth daily. 30 tablet 3  . metFORMIN (GLUCOPHAGE) 500 MG tablet Take 1 tablet (500 mg total) by mouth daily with breakfast. 30 tablet 0  . potassium chloride SA (K-DUR,KLOR-CON) 20 MEQ tablet Take 2 tablets (40 mEq total) by mouth daily. Take an extra 2 tabs when you take extra Furosemide 60 tablet 6  . spironolactone (ALDACTONE) 25 MG tablet Take 25 mg by mouth daily.    . sildenafil (VIAGRA) 50 MG tablet Take 1 tablet (50 mg total) by mouth daily as needed for erectile  dysfunction. 10 tablet 0   No current facility-administered medications for this encounter.     PHYSICAL EXAM: Filed Vitals:   04/26/15 1342  BP: 144/94  Pulse: 74  Height: 6' (1.829 m)  Weight: 259 lb (117.482 kg)  SpO2: 99%    General:  Well appearing. No resp difficulty. Ambulate in the clinic without difficulty.  HEENT: normal Neck: Thick. JVP not elevated. Carotids 2+ bilaterally; no bruits. No lymphadenopathy or thryomegaly appreciated. Cor: PMI normal. Regular rate & rhythm. No rubs, murmurs. +S4.  Lungs: crackles at bases bilaterally.  Abdomen: soft, nontender, nondistended. No hepatosplenomegaly. No bruits or masses. Good bowel sounds. Extremities: no cyanosis, clubbing, rash, trace ankle edema.  Neuro: alert & orientedx3, cranial nerves grossly intact. Moves all 4 extremities w/o  difficulty. Affect pleasant.  ASSESSMENT & PLAN: 1. Chronic Systolic Heart Failure: ECHO EF 15% in 8/15. No cardiac cath due to CKD, Cardiolite in 9/15 showed no ischemia.  Probably nonischemic cardiomyopathy due to uncontrolled HTN.  NYHA class II symptoms, improved with diuresis and BP control.  Not volume overloaded on exam today.  - Continue Lasix 80 mg daily.  BMET/BNP today.  - He is off Bidil, suspect he is not going to be able to take this medication due to headache.  - I will arrange for an echocardiogram.  If EF remains low, will need ICD (QRS narrow, not CRT candidate).  - Increase lisinopril to 20 mg daily with BMET in 2 wks.  - Make sure to take Coreg bid.  - Continue spironolactone.  - I have stressed the importance of ongoing medication compliance.  - Needs to follow low sodium diet.  2. HTN: BP better, adjust meds as above. It is critical to get his BP under control as I think this may improve his cardiac function.   3. CKD:  Suspect hypertensive nephropathy.  BMET today.  4. Diabetes: On metformin, needs to followup with a PCP.  [facility] as it is near his work.  5. Former Smoker Quit 2006 6. Former Cocaine Abuse.  7. Suspect OSA:  Missed his sleep study, will discuss rescheduling. 8. Obesity: Needs portion control and exercise.  Discussed this at length.  Will make referral to nutrition.  9. Erectile dysfunction: Will let him try Viagra.  He has no other nitrates and knows the risk of NTG use with Viagra.  Followup in office in 3 months  Marca AnconaDalton Felice Hope 04/27/2015

## 2015-05-10 ENCOUNTER — Ambulatory Visit (HOSPITAL_COMMUNITY): Admission: RE | Admit: 2015-05-10 | Payer: PRIVATE HEALTH INSURANCE | Source: Ambulatory Visit

## 2015-05-10 ENCOUNTER — Other Ambulatory Visit (HOSPITAL_COMMUNITY): Payer: PRIVATE HEALTH INSURANCE

## 2015-05-20 ENCOUNTER — Ambulatory Visit: Payer: PRIVATE HEALTH INSURANCE | Admitting: Dietician

## 2015-07-07 ENCOUNTER — Other Ambulatory Visit (HOSPITAL_COMMUNITY): Payer: Self-pay | Admitting: *Deleted

## 2015-07-07 MED ORDER — FUROSEMIDE 80 MG PO TABS: 80.0000 mg | ORAL_TABLET | Freq: Every day | ORAL | Status: DC

## 2015-11-24 ENCOUNTER — Other Ambulatory Visit (HOSPITAL_COMMUNITY): Payer: Self-pay | Admitting: *Deleted

## 2015-11-24 ENCOUNTER — Telehealth (HOSPITAL_COMMUNITY): Payer: Self-pay | Admitting: Vascular Surgery

## 2015-11-24 DIAGNOSIS — I509 Heart failure, unspecified: Secondary | ICD-10-CM

## 2015-11-24 MED ORDER — POTASSIUM CHLORIDE CRYS ER 20 MEQ PO TBCR: 40.0000 meq | EXTENDED_RELEASE_TABLET | Freq: Every day | ORAL | Status: DC

## 2015-11-24 NOTE — Telephone Encounter (Signed)
Left pt message to make f/u appt 

## 2015-12-27 ENCOUNTER — Other Ambulatory Visit (HOSPITAL_COMMUNITY): Payer: Self-pay | Admitting: *Deleted

## 2015-12-27 DIAGNOSIS — I509 Heart failure, unspecified: Secondary | ICD-10-CM

## 2015-12-27 MED ORDER — LISINOPRIL 20 MG PO TABS: 10.0000 mg | ORAL_TABLET | Freq: Every day | ORAL | Status: DC

## 2016-01-04 ENCOUNTER — Inpatient Hospital Stay (HOSPITAL_COMMUNITY): Admission: RE | Admit: 2016-01-04 | Payer: PRIVATE HEALTH INSURANCE | Source: Ambulatory Visit

## 2016-02-17 IMAGING — CR DG CHEST 2V
2 series · 2 of 2 positions shown · non-contrast
Comparison: 05/13/2013.

CLINICAL DATA: Fever.

EXAM:
CHEST  2 VIEW

[w chest pa]
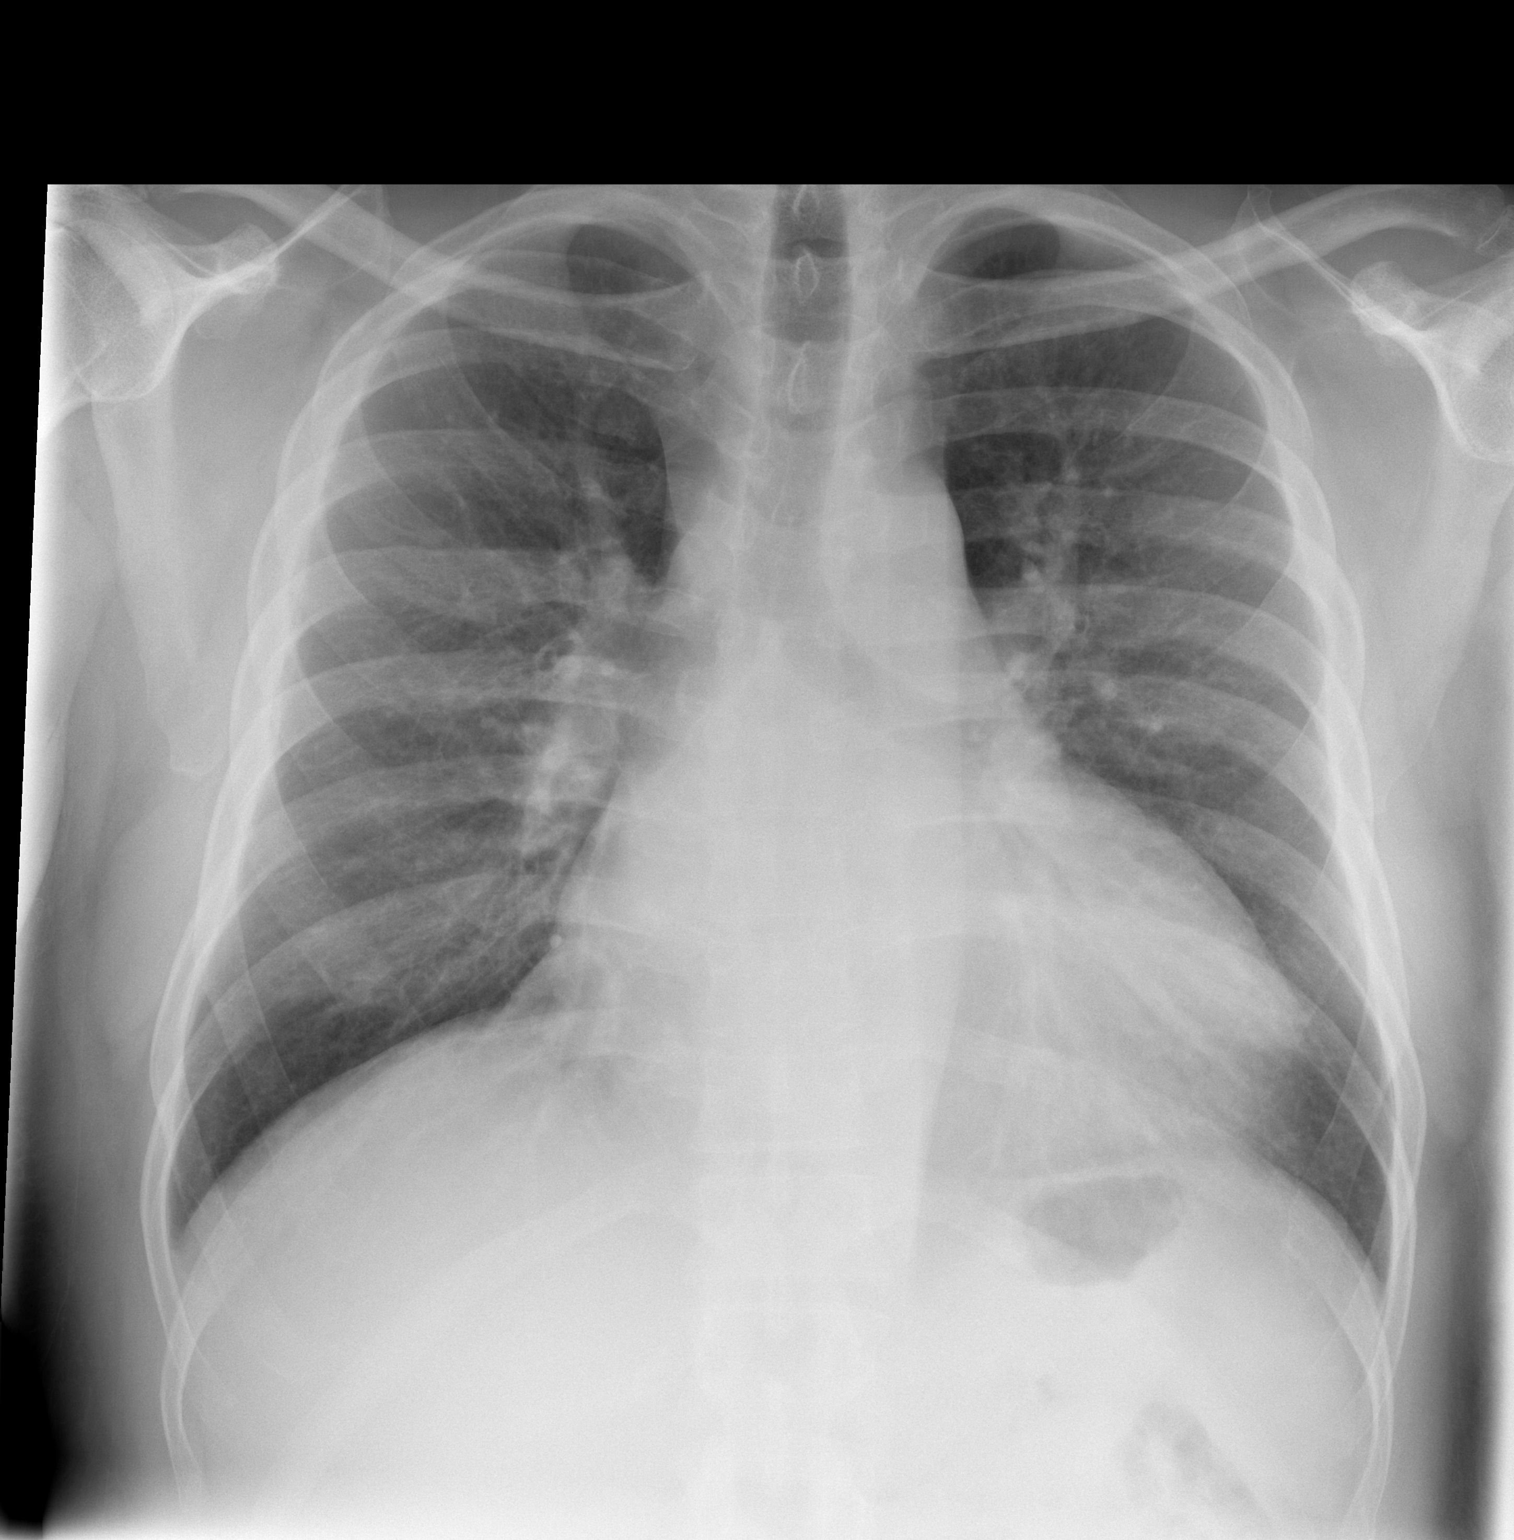

[w chest lat]
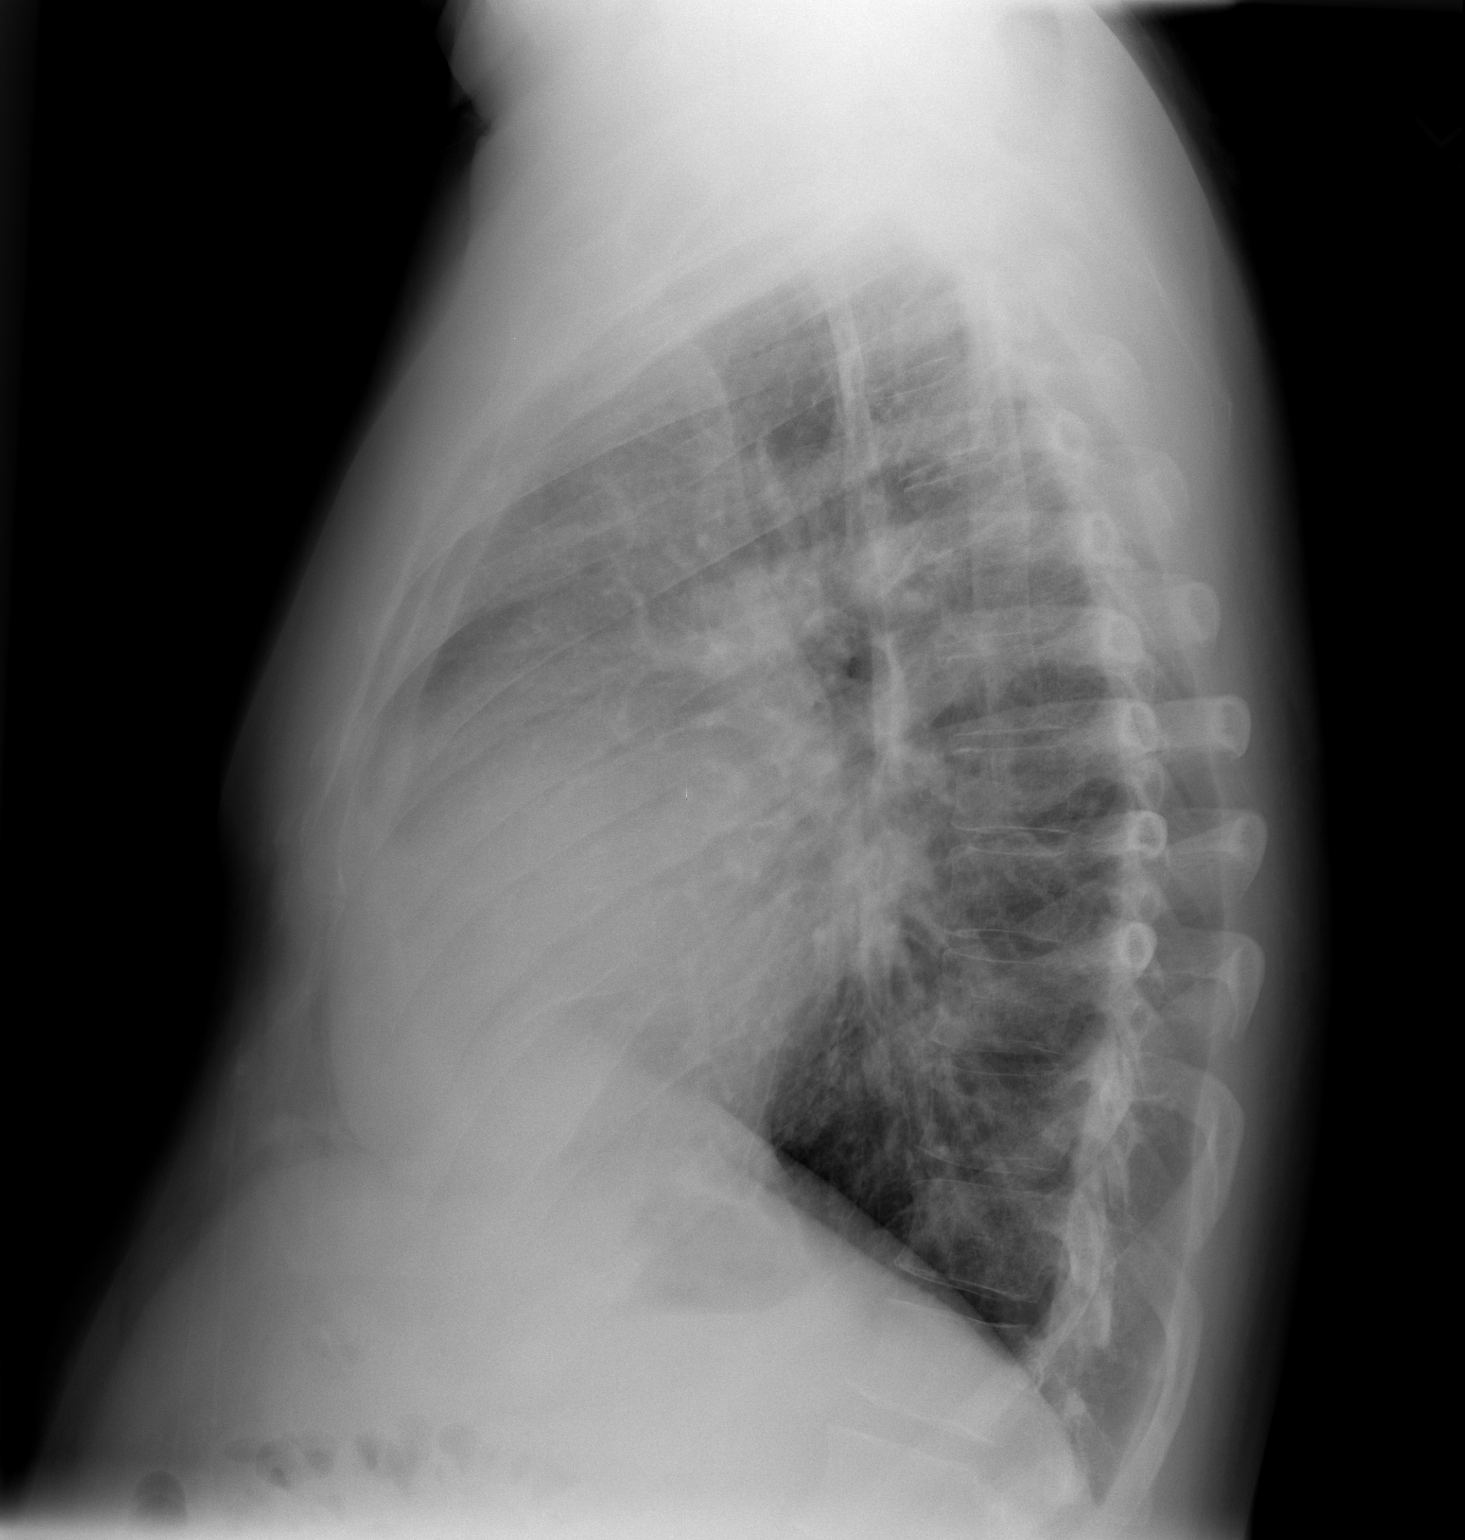

[2 of 2 positions shown; findings below may reference images not displayed]

FINDINGS: The heart is enlarged. There are no infiltrates or failure. There is
no effusion or pneumothorax. Bones are unremarkable. No change from
pri[employer].
IMPRESSION: Cardiomegaly, no active disease.

## 2016-04-14 ENCOUNTER — Other Ambulatory Visit (HOSPITAL_COMMUNITY): Payer: Self-pay | Admitting: Cardiology

## 2016-04-14 ENCOUNTER — Telehealth (HOSPITAL_COMMUNITY): Payer: Self-pay

## 2016-04-14 DIAGNOSIS — I509 Heart failure, unspecified: Secondary | ICD-10-CM

## 2016-04-14 MED ORDER — POTASSIUM CHLORIDE CRYS ER 20 MEQ PO TBCR: 40.0000 meq | EXTENDED_RELEASE_TABLET | Freq: Every day | ORAL | 0 refills | Status: DC

## 2016-04-14 NOTE — Telephone Encounter (Signed)
Patient called CHF clinic stating he is out of potassium and needs a refill today. Rx states no refills allowed until he is seen in our clinic (has not been seen in one year and continues to no show apts). Will send one 30 day refill with no remaining refills and will make notation on his directions on bottle he will no longer get refills until he is seen in our clinic.  Derek FilterBradley, Derek Chapman Genevea, RN

## 2016-05-07 ENCOUNTER — Other Ambulatory Visit (HOSPITAL_COMMUNITY): Payer: Self-pay | Admitting: Cardiology

## 2016-05-07 DIAGNOSIS — I509 Heart failure, unspecified: Secondary | ICD-10-CM

## 2016-05-10 ENCOUNTER — Other Ambulatory Visit (HOSPITAL_COMMUNITY): Payer: Self-pay | Admitting: *Deleted

## 2016-05-10 DIAGNOSIS — I509 Heart failure, unspecified: Secondary | ICD-10-CM

## 2016-05-19 ENCOUNTER — Other Ambulatory Visit (HOSPITAL_COMMUNITY): Payer: Self-pay | Admitting: Cardiology

## 2016-05-19 DIAGNOSIS — I509 Heart failure, unspecified: Secondary | ICD-10-CM

## 2016-06-12 ENCOUNTER — Other Ambulatory Visit (HOSPITAL_COMMUNITY): Payer: Self-pay | Admitting: Cardiology

## 2016-06-12 DIAGNOSIS — I509 Heart failure, unspecified: Secondary | ICD-10-CM

## 2016-07-05 ENCOUNTER — Other Ambulatory Visit (HOSPITAL_COMMUNITY): Payer: Self-pay | Admitting: Cardiology

## 2016-07-20 ENCOUNTER — Other Ambulatory Visit (HOSPITAL_COMMUNITY): Payer: Self-pay | Admitting: *Deleted

## 2016-07-20 MED ORDER — SILDENAFIL CITRATE 50 MG PO TABS: 50.0000 mg | ORAL_TABLET | Freq: Every day | ORAL | 0 refills | Status: DC | PRN

## 2016-12-05 ENCOUNTER — Other Ambulatory Visit (HOSPITAL_COMMUNITY): Payer: Self-pay | Admitting: Cardiology

## 2016-12-26 ENCOUNTER — Other Ambulatory Visit (HOSPITAL_COMMUNITY): Payer: Self-pay | Admitting: Cardiology

## 2016-12-26 ENCOUNTER — Other Ambulatory Visit (HOSPITAL_COMMUNITY): Payer: Self-pay

## 2016-12-26 DIAGNOSIS — I5022 Chronic systolic (congestive) heart failure: Secondary | ICD-10-CM

## 2016-12-26 MED ORDER — FUROSEMIDE 80 MG PO TABS: ORAL_TABLET | ORAL | 0 refills | Status: DC

## 2016-12-26 MED ORDER — CARVEDILOL 25 MG PO TABS: 25.0000 mg | ORAL_TABLET | Freq: Two times a day (BID) | ORAL | 0 refills | Status: AC

## 2017-01-25 ENCOUNTER — Other Ambulatory Visit (HOSPITAL_COMMUNITY): Payer: Self-pay | Admitting: *Deleted

## 2017-04-07 ENCOUNTER — Other Ambulatory Visit: Payer: Self-pay | Admitting: Internal Medicine

## 2021-06-30 ENCOUNTER — Encounter (HOSPITAL_COMMUNITY): Payer: Self-pay | Admitting: Internal Medicine

## 2021-06-30 ENCOUNTER — Other Ambulatory Visit: Payer: Self-pay

## 2021-06-30 ENCOUNTER — Emergency Department (HOSPITAL_COMMUNITY): Payer: 59

## 2021-06-30 ENCOUNTER — Observation Stay (HOSPITAL_COMMUNITY)
Admission: EM | Admit: 2021-06-30 | Discharge: 2021-07-01 | Disposition: A | Discharge status: Home / Self Care | Payer: 59 | Attending: Family Medicine | Admitting: Family Medicine

## 2021-06-30 DIAGNOSIS — I130 Hypertensive heart and chronic kidney disease with heart failure and stage 1 through stage 4 chronic kidney disease, or unspecified chronic kidney disease: Secondary | ICD-10-CM | POA: Diagnosis not present

## 2021-06-30 DIAGNOSIS — Z79899 Other long term (current) drug therapy: Secondary | ICD-10-CM | POA: Diagnosis not present

## 2021-06-30 DIAGNOSIS — I5023 Acute on chronic systolic (congestive) heart failure: Secondary | ICD-10-CM | POA: Diagnosis not present

## 2021-06-30 DIAGNOSIS — E119 Type 2 diabetes mellitus without complications: Secondary | ICD-10-CM

## 2021-06-30 DIAGNOSIS — Z20822 Contact with and (suspected) exposure to covid-19: Secondary | ICD-10-CM | POA: Diagnosis not present

## 2021-06-30 DIAGNOSIS — Z7982 Long term (current) use of aspirin: Secondary | ICD-10-CM | POA: Diagnosis not present

## 2021-06-30 DIAGNOSIS — Z7984 Long term (current) use of oral hypoglycemic drugs: Secondary | ICD-10-CM | POA: Diagnosis not present

## 2021-06-30 DIAGNOSIS — Z87891 Personal history of nicotine dependence: Secondary | ICD-10-CM | POA: Diagnosis not present

## 2021-06-30 DIAGNOSIS — N183 Chronic kidney disease, stage 3 unspecified: Secondary | ICD-10-CM | POA: Diagnosis not present

## 2021-06-30 DIAGNOSIS — R0602 Shortness of breath: Secondary | ICD-10-CM | POA: Diagnosis present

## 2021-06-30 DIAGNOSIS — E1122 Type 2 diabetes mellitus with diabetic chronic kidney disease: Secondary | ICD-10-CM | POA: Diagnosis not present

## 2021-06-30 DIAGNOSIS — I509 Heart failure, unspecified: Secondary | ICD-10-CM

## 2021-06-30 DIAGNOSIS — I10 Essential (primary) hypertension: Secondary | ICD-10-CM | POA: Diagnosis present

## 2021-06-30 LAB — CBC WITH DIFFERENTIAL/PLATELET
Abs Immature Granulocytes: 0.02 10*3/uL (ref 0.00–0.07)
Basophils Absolute: 0.1 10*3/uL (ref 0.0–0.1)
Basophils Relative: 1 %
Eosinophils Absolute: 0.1 10*3/uL (ref 0.0–0.5)
Eosinophils Relative: 2 %
HCT: 39.4 % (ref 39.0–52.0)
Hemoglobin: 12.7 g/dL — ABNORMAL LOW (ref 13.0–17.0)
Immature Granulocytes: 0 %
Lymphocytes Relative: 26 %
Lymphs Abs: 1.6 10*3/uL (ref 0.7–4.0)
MCH: 21.2 pg — ABNORMAL LOW (ref 26.0–34.0)
MCHC: 32.2 g/dL (ref 30.0–36.0)
MCV: 65.9 fL — ABNORMAL LOW (ref 80.0–100.0)
Monocytes Absolute: 0.4 10*3/uL (ref 0.1–1.0)
Monocytes Relative: 6 %
Neutro Abs: 4.1 10*3/uL (ref 1.7–7.7)
Neutrophils Relative %: 65 %
Platelets: 216 10*3/uL (ref 150–400)
RBC: 5.98 MIL/uL — ABNORMAL HIGH (ref 4.22–5.81)
RDW: 19.9 % — ABNORMAL HIGH (ref 11.5–15.5)
WBC: 6.3 10*3/uL (ref 4.0–10.5)
nRBC: 0 % (ref 0.0–0.2)

## 2021-06-30 LAB — COMPREHENSIVE METABOLIC PANEL
ALT: 22 U/L (ref 0–44)
AST: 21 U/L (ref 15–41)
Albumin: 2.9 g/dL — ABNORMAL LOW (ref 3.5–5.0)
Alkaline Phosphatase: 77 U/L (ref 38–126)
Anion gap: 10 (ref 5–15)
BUN: 22 mg/dL — ABNORMAL HIGH (ref 6–20)
CO2: 28 mmol/L (ref 22–32)
Calcium: 8.9 mg/dL (ref 8.9–10.3)
Chloride: 100 mmol/L (ref 98–111)
Creatinine, Ser: 1.7 mg/dL — ABNORMAL HIGH (ref 0.61–1.24)
GFR, Estimated: 48 mL/min — ABNORMAL LOW (ref >60)
Glucose, Bld: 143 mg/dL — ABNORMAL HIGH (ref 70–99)
Potassium: 3.2 mmol/L — ABNORMAL LOW (ref 3.5–5.1)
Sodium: 138 mmol/L (ref 135–145)
Total Bilirubin: 1 mg/dL (ref 0.3–1.2)
Total Protein: 6.4 g/dL — ABNORMAL LOW (ref 6.5–8.1)

## 2021-06-30 LAB — RESP PANEL BY RT-PCR (FLU A&B, COVID) ARPGX2
Influenza A by PCR: NEGATIVE
Influenza B by PCR: NEGATIVE
SARS Coronavirus 2 by RT PCR: NEGATIVE

## 2021-06-30 LAB — BRAIN NATRIURETIC PEPTIDE: B Natriuretic Peptide: 4141.6 pg/mL — ABNORMAL HIGH (ref 0.0–100.0)

## 2021-06-30 MED ORDER — FUROSEMIDE 10 MG/ML IJ SOLN
80.0000 mg | Freq: Two times a day (BID) | INTRAMUSCULAR | Status: DC
  Administered 2021-07-01: 06:00:00 80 mg via INTRAVENOUS
  Filled 2021-06-30: qty 8

## 2021-06-30 MED ORDER — ENOXAPARIN SODIUM 80 MG/0.8ML IJ SOSY
65.0000 mg | PREFILLED_SYRINGE | Freq: Every day | INTRAMUSCULAR | Status: DC
  Filled 2021-06-30: qty 0.65
  Filled 2021-06-30: qty 0.8

## 2021-06-30 MED ORDER — SPIRONOLACTONE 25 MG PO TABS
25.0000 mg | ORAL_TABLET | Freq: Every day | ORAL | Status: DC
  Administered 2021-07-01: 10:00:00 25 mg via ORAL
  Filled 2021-06-30: qty 1

## 2021-06-30 MED ORDER — ROSUVASTATIN CALCIUM 20 MG PO TABS
20.0000 mg | ORAL_TABLET | Freq: Every day | ORAL | Status: DC
  Administered 2021-07-01: 10:00:00 20 mg via ORAL
  Filled 2021-06-30: qty 1

## 2021-06-30 MED ORDER — INSULIN ASPART 100 UNIT/ML IJ SOLN: 0.0000 [IU] | Freq: Three times a day (TID) | INTRAMUSCULAR | Status: DC

## 2021-06-30 MED ORDER — ASPIRIN 81 MG PO CHEW
81.0000 mg | CHEWABLE_TABLET | Freq: Every day | ORAL | Status: DC
  Administered 2021-07-01: 10:00:00 81 mg via ORAL
  Filled 2021-06-30: qty 1

## 2021-06-30 MED ORDER — AMLODIPINE BESYLATE 5 MG PO TABS
5.0000 mg | ORAL_TABLET | Freq: Every day | ORAL | Status: DC
  Administered 2021-07-01: 10:00:00 5 mg via ORAL
  Filled 2021-06-30: qty 1

## 2021-06-30 MED ORDER — POTASSIUM CHLORIDE CRYS ER 20 MEQ PO TBCR
40.0000 meq | EXTENDED_RELEASE_TABLET | Freq: Once | ORAL | Status: AC
  Administered 2021-07-01: 40 meq via ORAL
  Filled 2021-06-30: qty 2

## 2021-06-30 MED ORDER — CARVEDILOL 25 MG PO TABS
25.0000 mg | ORAL_TABLET | Freq: Two times a day (BID) | ORAL | Status: DC
  Administered 2021-07-01 (×2): 25 mg via ORAL
  Filled 2021-06-30 (×2): qty 1

## 2021-06-30 MED ORDER — FUROSEMIDE 10 MG/ML IJ SOLN
80.0000 mg | Freq: Once | INTRAMUSCULAR | Status: AC
  Administered 2021-06-30: 22:00:00 80 mg via INTRAVENOUS
  Filled 2021-06-30: qty 8

## 2021-06-30 MED ORDER — SACUBITRIL-VALSARTAN 49-51 MG PO TABS
1.0000 | ORAL_TABLET | Freq: Two times a day (BID) | ORAL | Status: DC
  Administered 2021-07-01 (×2): 1 via ORAL
  Filled 2021-06-30 (×3): qty 1

## 2021-06-30 NOTE — H&P (Signed)
History and Physical    Derek Chapman [id number] DOB: [birth year] DOA: 06/30/2021  PCP: Patient, No Pcp Per (Inactive)  Patient coming from: Home.  Chief Complaint: Shortness of breath.  HPI: Derek Chapman is a 55 y.o. male with history of chronic systolic heart failure last EF measured in October 2018 was 25 to 30% with history of diabetes mellitus type 2, hypertension and chronic kidney disease stage III presents to the ER because of increasing shortness of breath.  Patient states he was doing well until 3 weeks ago when he started noticing increasing lower extremity edema and shortness of breath.  About 2 weeks ago he had gone to the ER in [place] where he lives.  Over there in the ER patient's Lasix dose was increased and had followed up with his primary care physician last week when it was noticed that patient's symptoms has not improved so spironolactone was added.  Patient was driving to [facility] and was planning to go to [place] took a break in [place] to meet his daughter and was living in the motel when patient became more short of breath.  Denies any chest pain productive cough fever or chills.  He did recently take some NSAIDs for pain.  ED Course: In the ER patient has significant edema in both lower extremities extending up to thighs and also elevated JVD.  Chest x-ray shows mild edema.  Labs show BNP of 4100 potassium 3.2 creatinine 1.7 hemoglobin 12.7 EKG shows normal sinus rhythm with first-degree AV block.  COVID test was negative.  Patient was given Lasix 80 mg IV admitted for acute on chronic systolic heart failure.  Review of Systems: As per HPI, rest all negative.   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 He quit smokeless tobacco use about 16 years ago. He reports current alcohol use. He reports that he does not currently use drugs after having used the following drugs: Marijuana and Cocaine.  No Known Allergies  Family History  Problem Relation Age of Onset   Other Neg Hx    Diabetes Mother    Hypertension Father    Cancer Father     Prior to Admission medications   Medication Sig Start Date End Date Taking? Authorizing Provider  amLODipine (NORVASC) 5 MG tablet take 1 tablet by mouth once daily Patient taking differently: Take 5 mg by mouth daily. 07/06/16  Yes Larey Dresser, MD  aspirin 81 MG tablet Take 81 mg by mouth daily.   Yes [provider]  carvedilol (COREG) 25 MG tablet Take 1 tablet (25 mg total) by mouth 2 (two) times daily with a meal. MUST HAVE APT WITH CHF CLINIC FOR FUTURE REFILLS. 12/26/16  Yes Larey Dresser, MD  furosemide (LASIX) 40 MG tablet Take 40 mg by mouth See admin instructions. Bid x 5 days   Yes [provider]  metFORMIN (GLUCOPHAGE) 500 MG tablet Take 1 tablet (500 mg total) by mouth daily with breakfast. Patient taking differently: Take 500 mg by mouth 2 (two) times daily with a meal. 04/17/14  Yes Bensimhon, Bevelyn Bucklesaniel R, MD  multivitamin (ONE-A-DAY MEN'S) TABS tablet Take 1 tablet by mouth daily.   Yes [provider]  naproxen sodium (ALEVE) 220 MG tablet Take 220 mg by mouth daily as needed (pain).   Yes [provider]  OVER THE COUNTER MEDICATION Take 1 tablet by mouth daily as needed (when working out). Force factor Score!   Yes [provider]  rosuvastatin (CRESTOR) 10 MG tablet Take 10 mg by mouth daily. 06/27/21  Yes  [provider]  sacubitril-valsartan (ENTRESTO) 49-51 MG Take 1 tablet by mouth every 12 (twelve) hours. 03/03/21  Yes [provider]  spironolactone (ALDACTONE) 25 MG tablet Take 1 tablet (25 mg total) by mouth daily. 07/06/16  Yes Laurey MoraleMcLean, Dalton S, MD  tadalafil (CIALIS) 5 MG tablet Take 5 mg by mouth daily as needed. 08/18/20  Yes [provider]  furosemide (LASIX) 80 MG tablet Take 80mg  (1 tab) once daily.  If weight 254 lbs or more take extra 40mg  (1/2 tab) in pm for 3 days. MAKE APT WITH CHF CLINIC FOR REFILLS. Patient not taking: Reported on 06/30/2021 12/26/16   Laurey MoraleMcLean, Dalton S, MD  lisinopril (PRINIVIL,ZESTRIL) 20 MG tablet Take 0.5 tablets (10 mg total) by mouth daily. Patient not taking: Reported on 06/30/2021 12/27/15   Laurey MoraleMcLean, Dalton S, MD  lisinopril (PRINIVIL,ZESTRIL) 20 MG tablet take 1/2 tablet by mouth once daily Patient not taking: Reported on 06/30/2021 05/19/16   Laurey MoraleMcLean, Dalton S, MD  potassium chloride SA (K-DUR,KLOR-CON) 20 MEQ tablet take 2 tablets by mouth once daily TAKE AN EXTRA 2 TABLETS WHEN YOU TAKE EXTRA FUROSEMIDE Patient not taking: Reported on 06/30/2021 04/14/16   Bensimhon, Bevelyn Bucklesaniel R, MD  potassium chloride SA (K-DUR,KLOR-CON) 20 MEQ tablet TAKE 2 TABLETS BY MOUTH DAILY. TAKE AN EXTRA 2 TABLETS WITH LASIX Patient not taking: Reported on 06/30/2021 06/13/16   Laurey MoraleMcLean, Dalton S, MD  sildenafil (VIAGRA) 50 MG tablet Take 1 tablet (50 mg total) by mouth daily as needed for erectile dysfunction. Patient not taking: Reported on 06/30/2021 07/20/16   Laurey MoraleMcLean, Dalton S, MD    Physical Exam: Constitutional: Moderately built and nourished. Vitals:   06/30/21 1921 06/30/21 1947 06/30/21 2137 06/30/21 2215  BP: (!) 140/100  (!) 136/109 (!) 130/96  Pulse: 83  80 80  Resp: (!) 22  19 17   Temp:   98 F (36.7 C)   SpO2: 92%  100% 95%  Weight:  127 kg    Height:  5\' 11"  (1.803 m)     Eyes: Anicteric no pallor. ENMT: No discharge from the ears  eyes nose and mouth. Neck: JVD elevated no mass felt. Respiratory: No rhonchi or crepitations. Cardiovascular: S1-S2 heard. Abdomen: Soft nontender bowel sound present. Musculoskeletal: Bilateral lower extremity edema extending up to the thighs. Skin: No rash. Neurologic: Alert awake oriented time place and person.  Moves all extremities. Psychiatric: Appears normal.  Normal affect.   Labs on Admission: I have personally reviewed following labs and imaging studies  CBC: Recent Labs  Lab 06/30/21 2009  WBC 6.3  NEUTROABS 4.1  HGB 12.7*  HCT 39.4  MCV 65.9*  PLT 216   Basic Metabolic Panel: Recent Labs  Lab 06/30/21 2009  NA 138  K 3.2*  CL 100  CO2 28  GLUCOSE 143*  BUN 22*  CREATININE 1.70*  CALCIUM 8.9   GFR: Estimated Creatinine Clearance: 68.2 mL/min (A) (by C-G formula based on SCr of 1.7 mg/dL (H)). Liver Function Tests: Recent Labs  Lab 06/30/21 2009  AST 21  ALT 22  ALKPHOS 77  BILITOT 1.0  PROT 6.4*  ALBUMIN 2.9*   No results for input(s): LIPASE, AMYLASE in the last 168 hours. No results for input(s): AMMONIA in the last 168 hours. Coagulation Profile: No results for input(s): INR, PROTIME in the last 168 hours. Cardiac Enzymes: No results for input(s): CKTOTAL, CKMB, CKMBINDEX, TROPONINI in the last 168 hours. BNP (last 3 results) No results for input(s): PROBNP in the last 8760 hours. HbA1C: No results for input(s): HGBA1C in the last 72 hours. CBG: No results for input(s): GLUCAP in the last 168 hours. Lipid Profile: No results for input(s): CHOL, HDL, LDLCALC, TRIG, CHOLHDL, LDLDIRECT in the last 72 hours. Thyroid Function Tests: No results for input(s): TSH, T4TOTAL, FREET4, T3FREE, THYROIDAB in the last 72 hours. Anemia Panel: No results for input(s): VITAMINB12, FOLATE, FERRITIN, TIBC, IRON, RETICCTPCT in the last 72 hours. Urine analysis:    Component Value Date/Time   COLORURINE YELLOW 05/14/2013 1210   APPEARANCEUR CLEAR  05/14/2013 1210   LABSPEC 1.014 05/14/2013 1210   PHURINE 7.5 05/14/2013 1210   GLUCOSEU 100 (A) 05/14/2013 1210   HGBUR NEGATIVE 05/14/2013 1210   BILIRUBINUR NEGATIVE 05/14/2013 1210   KETONESUR NEGATIVE 05/14/2013 1210   PROTEINUR 30 (A) 05/14/2013 1210   UROBILINOGEN 1.0 05/14/2013 1210   NITRITE NEGATIVE 05/14/2013 1210   LEUKOCYTESUR NEGATIVE 05/14/2013 1210   Sepsis Labs: @LABRCNTIP (procalcitonin:4,lacticidven:4) ) Recent Results (from the past 240 hour(s))  Resp Panel by RT-PCR (Flu A&B, Covid) Nasopharyngeal Swab     Status: None   Collection Time: 06/30/21  9:38 PM   Specimen: Nasopharyngeal Swab; Nasopharyngeal(NP) swabs in vial transport medium  Result Value Ref Range Status   SARS Coronavirus 2 by RT PCR NEGATIVE NEGATIVE Final    Comment: (NOTE) SARS-CoV-2 target nucleic acids are NOT DETECTED.  The SARS-CoV-2 RNA is generally detectable in upper respiratory specimens during the acute phase of infection. The lowest concentration of SARS-CoV-2 viral copies this assay can detect is 138 copies/mL. A negative result does not preclude SARS-Cov-2 infection and should not be used as the sole basis for treatment or other patient management decisions. A negative result may occur with  improper specimen collection/handling, submission of specimen other than nasopharyngeal swab, presence of viral mutation(s) within the areas targeted by this assay, and inadequate number of viral copies(<138 copies/mL). A negative result must be combined with clinical observations, patient history, and epidemiological information. The expected result is Negative.  Fact Sheet for Patients:  07/02/21  Fact Sheet for Healthcare Providers:  BloggerCourse.com  This test is no t yet approved or cleared by the [employer] FDA and  has been authorized for detection and/or diagnosis of SARS-CoV-2 by FDA under an Emergency Use  Authorization (EUA). This EUA will remain  in effect (meaning this test can be used) for the duration of the COVID-19 declaration under Section 564(b)(1) of the Act, 21 U.S.C.section 360bbb-3(b)(1), unless the authorization is terminated  or revoked sooner.       Influenza A by PCR NEGATIVE NEGATIVE Final   Influenza B by PCR NEGATIVE NEGATIVE Final    Comment: (NOTE) The Xpert Xpress SARS-CoV-2/FLU/RSV plus assay is intended as an aid in the diagnosis of influenza from Nasopharyngeal swab specimens and  should not be used as a sole basis for treatment. Nasal washings and aspirates are unacceptable for Xpert Xpress SARS-CoV-2/FLU/RSV testing.  Fact Sheet for Patients: EntrepreneurPulse.com.au  Fact Sheet for Healthcare Providers: IncredibleEmployment.be  This test is not yet approved or cleared by the [place] FDA and has been authorized for detection and/or diagnosis of SARS-CoV-2 by FDA under an Emergency Use Authorization (EUA). This EUA will remain in effect (meaning this test can be used) for the duration of the COVID-19 declaration under Section 564(b)(1) of the Act, 21 U.S.C. section 360bbb-3(b)(1), unless the authorization is terminated or revoked.  Performed at [facility], [place] [address] [place], [place] [address]      Radiological Exams on Admission: DG Chest 2 View  Result Date: 06/30/2021 CLINICAL DATA:  Shortness of breath. EXAM: CHEST - 2 VIEW COMPARISON:  March 03, 2014 FINDINGS: Mildly increased bilateral perihilar interstitial lung markings are seen. There is no evidence of focal consolidation, pleural effusion or pneumothorax. The cardiac silhouette is mildly enlarged. The visualized skeletal structures are unremarkable. IMPRESSION: Mildly increased bilateral perihilar interstitial lung markings which may represent mild interstitial edema. Electronically Signed   By: Virgina Norfolk M.D.   On: 06/30/2021  20:38    EKG: Independently reviewed.  Normal sinus rhythm with first-degree AV block.  Assessment/Plan Principal Problem:   Acute on chronic systolic CHF (congestive heart failure) (HCC) Active Problems:   CKD (chronic kidney disease) stage 3, GFR 30-59 ml/min (HCC)   Diabetes mellitus (HCC)   HTN (hypertension), benign   Acute CHF (congestive heart failure) ([place])    Acute on chronic systolic heart failure last EF measured in October 2018 was 25 to 30%.  Presently patient has been placed on Lasix 80 mg IV every 12 and we will continue patient's Entresto carvedilol spironolactone closely follow intake output metabolic panel daily weights.  Since there is significant change in his symptoms we will check 2D echo.  Also check Dopplers of the lower extremities. Hypertension on amlodipine carvedilol and Entresto. Chronic kidney disease stage III creatinine appears to be at baseline.  Note that patient is on IV Lasix and also is on spironolactone and Entresto. Diabetes mellitus type 2 usually takes metformin at home.  We will keep patient on sliding scale coverage. Chronic anemia likely from renal disease.  Follow CBC.  Check anemia panel. Hyperlipidemia on statins.  Since patient has significant edema with symptoms will need aggressive IV diuresis and closely monitoring his metabolic panel and further work-up will need inpatient status.   DVT prophylaxis: Lovenox. Code Status: Full code. Family Communication: Discussed with patient. Disposition Plan: Home. Consults called: Will need cardiology consult. Admission status: Inpatient.   Rise Patience MD [facility] Pager [phone].  If 7PM-7AM, please contact night-coverage [email or web address] Password [facility]  06/30/2021, 11:22 PM

## 2021-06-30 NOTE — ED Triage Notes (Signed)
Pt c/o shortness of breath. Pt states he feels like he has fluid on his lungs. Pt reports taking lasix. Pt also reports swollen legs and ankles and fatigue.

## 2021-06-30 NOTE — ED Provider Notes (Signed)
Emergency Medicine Provider Triage Evaluation Note  Derek Chapman , a 55 y.o. male  was evaluated in triage.  Pt complains of shortness of breath.  Patient reports that he has a history of CHF and believes that he has fluid overload at this time.  States that his weight is up 15 pounds over the last 7 days.  Endorses swelling to bilateral lower legs and abdomen.  Patient states that he has been taking his prescribed Lasix with no improvement.  Review of Systems  Positive: Shortness of breath, orthopnea, edema to bilateral lower extremities, fatigue Negative: Chest pain, rhinorrhea, nasal congestion, cough, fevers, chills  Physical Exam  BP (!) 140/100 (BP Location: Left Arm)    Pulse 83    Resp (!) 22    Ht 5\' 11"  (1.803 m)    Wt 127 kg    SpO2 92%    BMI 39.05 kg/m  Gen:   Awake, no distress   Resp:  Cytopenia, subtle rales to bilateral lower lobes MSK:   Moves extremities without difficulty; edema to bilateral lower extremities Other:  Abdomen soft, nondistended, nontender  Medical Decision Making  Medically screening exam initiated at 8:03 PM.  Appropriate orders placed.  Dani Thomason was informed that the remainder of the evaluation will be completed by another provider, this initial triage assessment does not replace that evaluation, and the importance of remaining in the ED until their evaluation is complete.  Concern for possible acute CHF exacerbation.   Jackquline Bosch 06/30/21 2004    07/02/21, MD 06/30/21 2330

## 2021-06-30 NOTE — ED Provider Notes (Signed)
[facility] EMERGENCY DEPARTMENT Provider Note   CSN: [id number] Arrival date & time: 06/30/21  1910     History Chief Complaint  Patient presents with   Shortness of Breath    Derek Chapman is a 55 y.o. male with past medical history significant for HTN, DM2, HFrEF (EF 25-30% 2018) who presents with worsening shortness of breath.  Patient originally presented to OSH ED on 11/30 for shortness of breath.  At that time his BNP was in the 3000s and he was treated for heart failure exacerbation with diuretics and discharged with instructions to double his daily Lasix dose.  He followed up with his PCP on 12/19 at which time he was started on spironolactone and instructed to continue taking the increased Lasix dose.  Since his PCPs appointment, the patient's symptoms of exertional shortness of breath, extremity swelling, orthopnea have gotten worse.  He was driving from [place] to [place] today for the holidays, but had to pull over because of his shortness of breath and decided to come to the emergency department.    Past Medical History:  Diagnosis Date   Cardiomyopathy [facility]    a) thought to be NICM r/t HTN b) Lexiscan Myoview (03/2014): fixed medium-sized mild basal to mid inferior perfusion defect, EF 20%, doubt infarction, no ischemia   Chronic systolic HF (heart failure) (HCC)    a) ECHO (02/2014): EF 15%, grade II DD, mild MR, RV mildly dilated and systolic fx mod reduced   CKD (chronic kidney disease), stage III ([place])    Hypertension    Kidney stone     Patient Active Problem List   Diagnosis Date Noted   Acute on chronic systolic CHF (congestive heart failure) ([place]) 06/30/2021   Cocaine abuse in remission ([place]) 0000000   Chronic systolic heart failure ([place]) 03/13/2014   HTN (hypertension), benign 99991111   Acute systolic congestive heart failure ([place]) 03/06/2014   Diabetes mellitus ([place]) 03/06/2014   Acute congestive heart failure ([place])  03/03/2014   Hypokalemia 03/03/2014   Tachycardia 03/03/2014   Stable angina ([place]) 03/03/2014   Acute kidney injury ([place]) 03/03/2014   Acute CHF ([place]) 03/03/2014   CKD (chronic kidney disease) stage 3, GFR 30-59 ml/min (HCC) 03/03/2014   Glycosuria 03/03/2014   SOB (shortness of breath) 05/14/2013   Numbness in right leg 05/14/2013   Accelerated hypertension 05/14/2013   Kidney stone     Past Surgical History:  Procedure Laterality Date   NO PAST SURGERIES         Family History  Problem Relation Age of Onset   Other Neg Hx    Diabetes Mother    Hypertension Father    Cancer Father     Social History   Tobacco Use   Smoking status: Former   Smokeless tobacco: Former    Quit date: 07/10/2004   Tobacco comments:    Smoked from 2003-2006  Substance Use Topics   Alcohol use: Yes    Comment: 2 per month   Drug use: Yes    Types: Marijuana, Cocaine    Home Medications Prior to Admission medications   Medication Sig Start Date End Date Taking? Authorizing Provider  amLODipine (NORVASC) 5 MG tablet take 1 tablet by mouth once daily 07/06/16   Larey Dresser, MD  aspirin 81 MG tablet Take 81 mg by mouth daily.    [provider]  carvedilol (COREG) 25 MG tablet Take 1 tablet (25 mg total) by mouth 2 (two)  times daily with a meal. MUST HAVE APT WITH CHF CLINIC FOR FUTURE REFILLS. 12/26/16   Larey Dresser, MD  furosemide (LASIX) 80 MG tablet Take 80mg  (1 tab) once daily.  If weight 254 lbs or more take extra 40mg  (1/2 tab) in pm for 3 days. MAKE APT WITH CHF CLINIC FOR REFILLS. 12/26/16   Larey Dresser, MD  lisinopril (PRINIVIL,ZESTRIL) 20 MG tablet Take 0.5 tablets (10 mg total) by mouth daily. 12/27/15   Larey Dresser, MD  lisinopril (PRINIVIL,ZESTRIL) 20 MG tablet take 1/2 tablet by mouth once daily 05/19/16   Larey Dresser, MD  metFORMIN (GLUCOPHAGE) 500 MG tablet Take 1 tablet (500 mg total) by mouth daily with breakfast. 04/17/14   Bensimhon, Shaune Pascal,  MD  potassium chloride SA (K-DUR,KLOR-CON) 20 MEQ tablet take 2 tablets by mouth once daily TAKE AN EXTRA 2 TABLETS WHEN YOU TAKE EXTRA FUROSEMIDE 04/14/16   Bensimhon, Shaune Pascal, MD  potassium chloride SA (K-DUR,KLOR-CON) 20 MEQ tablet TAKE 2 TABLETS BY MOUTH DAILY. TAKE AN EXTRA 2 TABLETS WITH LASIX 06/13/16   Larey Dresser, MD  sildenafil (VIAGRA) 50 MG tablet Take 1 tablet (50 mg total) by mouth daily as needed for erectile dysfunction. 07/20/16   Larey Dresser, MD  spironolactone (ALDACTONE) 25 MG tablet Take 25 mg by mouth daily.    [provider]  spironolactone (ALDACTONE) 25 MG tablet Take 1 tablet (25 mg total) by mouth daily. 07/06/16   Larey Dresser, MD    Allergies    Patient has no known allergies.  Review of Systems   Review of Systems  Constitutional:  Negative for chills and fever.  HENT:  Negative for ear pain and sore throat.   Eyes:  Negative for pain and visual disturbance.  Respiratory:  Positive for cough, chest tightness and shortness of breath.   Cardiovascular:  Positive for leg swelling. Negative for chest pain and palpitations.  Gastrointestinal:  Negative for abdominal pain, constipation, diarrhea, nausea and vomiting.  Genitourinary:  Negative for dysuria and hematuria.  Musculoskeletal:  Negative for arthralgias and back pain.  Skin:  Negative for color change and rash.  Neurological:  Negative for seizures, syncope and headaches.  All other systems reviewed and are negative.  Physical Exam Updated Vital Signs BP (!) 130/96    Pulse 80    Resp 17    Ht 5\' 11"  (1.803 m)    Wt 127 kg    SpO2 95%    BMI 39.05 kg/m   Physical Exam Vitals and nursing note reviewed.  Constitutional:      General: He is not in acute distress.    Appearance: He is well-developed.  HENT:     Head: Normocephalic and atraumatic.     Right Ear: External ear normal.     Left Ear: External ear normal.     Nose: Nose normal.     Mouth/Throat:     Mouth: Mucous  membranes are moist.     Pharynx: Oropharynx is clear.  Eyes:     Extraocular Movements: Extraocular movements intact.     Conjunctiva/sclera: Conjunctivae normal.     Pupils: Pupils are equal, round, and reactive to light.  Neck:     Vascular: JVD present.  Cardiovascular:     Rate and Rhythm: Normal rate and regular rhythm.     Pulses:          Radial pulses are 2+ on the right side and 2+ on the  left side.     Heart sounds: No murmur heard. Pulmonary:     Effort: Pulmonary effort is normal. No respiratory distress.     Breath sounds: Rales (bibasilar) present.  Abdominal:     Palpations: Abdomen is soft.     Tenderness: There is no abdominal tenderness.  Musculoskeletal:        General: No swelling.     Cervical back: Neck supple.     Right lower leg: Edema present.     Left lower leg: Edema present.  Skin:    General: Skin is warm and dry.     Capillary Refill: Capillary refill takes less than 2 seconds.  Neurological:     Mental Status: He is alert and oriented to person, place, and time.     GCS: GCS eye subscore is 4. GCS verbal subscore is 5. GCS motor subscore is 6.  Psychiatric:        Mood and Affect: Mood normal.    ED Results / Procedures / Treatments   Labs (all labs ordered are listed, but only abnormal results are displayed) Labs Reviewed  BRAIN NATRIURETIC PEPTIDE - Abnormal; Notable for the following components:      Result Value   B Natriuretic Peptide 4,141.6 (*)    All other components within normal limits  COMPREHENSIVE METABOLIC PANEL - Abnormal; Notable for the following components:   Potassium 3.2 (*)    Glucose, Bld 143 (*)    BUN 22 (*)    Creatinine, Ser 1.70 (*)    Total Protein 6.4 (*)    Albumin 2.9 (*)    GFR, Estimated 48 (*)    All other components within normal limits  CBC WITH DIFFERENTIAL/PLATELET - Abnormal; Notable for the following components:   RBC 5.98 (*)    Hemoglobin 12.7 (*)    MCV 65.9 (*)    MCH 21.2 (*)    RDW  19.9 (*)    All other components within normal limits  RESP PANEL BY RT-PCR (FLU A&B, COVID) [id number]    EKG EKG Interpretation  Date/Time:  Thursday June 30 2021 19:32:38 EST Ventricular Rate:  81 PR Interval:  230 QRS Duration: 118 QT Interval:  394 QTC Calculation: 457 R Axis:   135 Text Interpretation: Sinus rhythm with 1st degree A-V block with frequent Premature ventricular complexes Left posterior fascicular block Possible Anterior infarct , age undetermined Abnormal ECG nonspecific changes since previous Confirmed by Richardean Canal ([id number]) on 06/30/2021 9:35:01 PM  Radiology DG Chest 2 View  Result Date: 06/30/2021 CLINICAL DATA:  Shortness of breath. EXAM: CHEST - 2 VIEW COMPARISON:  March 03, 2014 FINDINGS: Mildly increased bilateral perihilar interstitial lung markings are seen. There is no evidence of focal consolidation, pleural effusion or pneumothorax. The cardiac silhouette is mildly enlarged. The visualized skeletal structures are unremarkable. IMPRESSION: Mildly increased bilateral perihilar interstitial lung markings which may represent mild interstitial edema. Electronically Signed   By: Aram Candela M.D.   On: 06/30/2021 20:38    Procedures Procedures   Medications Ordered in ED Medications  furosemide (LASIX) injection 80 mg (80 mg Intravenous Given 06/30/21 2206)    ED Course  I have reviewed the triage vital signs and the nursing notes.  Pertinent labs & imaging results that were available during my care of the patient were reviewed by me and considered in my medical decision making (see chart for details).    MDM Rules/Calculators/A&P  Presentation and physical exam as documented above consistent with acute on chronic heart failure exacerbation.  Patient was given a dose of IV Lasix in the emergency department.  I discussed the patient with the hospitalist medicine team who will admit the patient for heart failure  exacerbation.  Final Clinical Impression(s) / ED Diagnoses Final diagnoses:  None    Rx / DC Orders ED Discharge Orders     None        Shirl Ludington, Amalia Hailey, MD 07/01/21 [id number]    Drenda Freeze, MD 07/01/21 [phone]

## 2021-07-01 ENCOUNTER — Inpatient Hospital Stay (HOSPITAL_BASED_OUTPATIENT_CLINIC_OR_DEPARTMENT_OTHER): Payer: 59

## 2021-07-01 ENCOUNTER — Inpatient Hospital Stay (HOSPITAL_COMMUNITY): Payer: 59

## 2021-07-01 DIAGNOSIS — I5023 Acute on chronic systolic (congestive) heart failure: Secondary | ICD-10-CM | POA: Diagnosis not present

## 2021-07-01 DIAGNOSIS — M7989 Other specified soft tissue disorders: Secondary | ICD-10-CM | POA: Diagnosis not present

## 2021-07-01 LAB — MAGNESIUM
Magnesium: 1.6 mg/dL — ABNORMAL LOW (ref 1.7–2.4)
Magnesium: 1.6 mg/dL — ABNORMAL LOW (ref 1.7–2.4)

## 2021-07-01 LAB — BASIC METABOLIC PANEL
Anion gap: 9 (ref 5–15)
Anion gap: 14 (ref 5–15)
BUN: 22 mg/dL — ABNORMAL HIGH (ref 6–20)
BUN: 22 mg/dL — ABNORMAL HIGH (ref 6–20)
CO2: 30 mmol/L (ref 22–32)
CO2: 27 mmol/L (ref 22–32)
Calcium: 8.7 mg/dL — ABNORMAL LOW (ref 8.9–10.3)
Calcium: 8.7 mg/dL — ABNORMAL LOW (ref 8.9–10.3)
Chloride: 102 mmol/L (ref 98–111)
Chloride: 101 mmol/L (ref 98–111)
Creatinine, Ser: 1.62 mg/dL — ABNORMAL HIGH (ref 0.61–1.24)
Creatinine, Ser: 1.61 mg/dL — ABNORMAL HIGH (ref 0.61–1.24)
GFR, Estimated: 50 mL/min — ABNORMAL LOW (ref >60)
GFR, Estimated: 51 mL/min — ABNORMAL LOW (ref >60)
Glucose, Bld: 171 mg/dL — ABNORMAL HIGH (ref 70–99)
Glucose, Bld: 168 mg/dL — ABNORMAL HIGH (ref 70–99)
Potassium: 3.3 mmol/L — ABNORMAL LOW (ref 3.5–5.1)
Potassium: 3.4 mmol/L — ABNORMAL LOW (ref 3.5–5.1)
Sodium: 141 mmol/L (ref 135–145)
Sodium: 142 mmol/L (ref 135–145)

## 2021-07-01 LAB — ECHOCARDIOGRAM COMPLETE
Calc EF: 23.7 %
Height: 72 in
MV M vel: 4.34 m/s
MV Peak grad: 75.3 mmHg
Radius: 0.4 cm
S' Lateral: 6.3 cm
Single Plane A2C EF: 25.6 %
Single Plane A4C EF: 20 %
Weight: 4391.56 oz

## 2021-07-01 LAB — RETICULOCYTES
Immature Retic Fract: 34.6 % — ABNORMAL HIGH (ref 2.3–15.9)
RBC.: 5.56 MIL/uL (ref 4.22–5.81)
Retic Count, Absolute: 96.2 10*3/uL (ref 19.0–186.0)
Retic Ct Pct: 1.7 % (ref 0.4–3.1)

## 2021-07-01 LAB — GLUCOSE, CAPILLARY
Glucose-Capillary: 142 mg/dL — ABNORMAL HIGH (ref 70–99)
Glucose-Capillary: 144 mg/dL — ABNORMAL HIGH (ref 70–99)

## 2021-07-01 LAB — CBC
HCT: 36.4 % — ABNORMAL LOW (ref 39.0–52.0)
Hemoglobin: 11.8 g/dL — ABNORMAL LOW (ref 13.0–17.0)
MCH: 21.1 pg — ABNORMAL LOW (ref 26.0–34.0)
MCHC: 32.4 g/dL (ref 30.0–36.0)
MCV: 65.1 fL — ABNORMAL LOW (ref 80.0–100.0)
Platelets: 207 10*3/uL (ref 150–400)
RBC: 5.59 MIL/uL (ref 4.22–5.81)
RDW: 19.4 % — ABNORMAL HIGH (ref 11.5–15.5)
WBC: 6.1 10*3/uL (ref 4.0–10.5)
nRBC: 0 % (ref 0.0–0.2)

## 2021-07-01 LAB — HEMOGLOBIN A1C
Hgb A1c MFr Bld: 8 % — ABNORMAL HIGH (ref 4.8–5.6)
Mean Plasma Glucose: 182.9 mg/dL

## 2021-07-01 LAB — HIV ANTIBODY (ROUTINE TESTING W REFLEX): HIV Screen 4th Generation wRfx: NONREACTIVE

## 2021-07-01 LAB — TSH: TSH: 1.367 u[IU]/mL (ref 0.350–4.500)

## 2021-07-01 LAB — IRON AND TIBC
Iron: 34 ug/dL — ABNORMAL LOW (ref 45–182)
Saturation Ratios: 8 % — ABNORMAL LOW (ref 17.9–39.5)
TIBC: 413 ug/dL (ref 250–450)
UIBC: 379 ug/dL

## 2021-07-01 LAB — FERRITIN: Ferritin: 31 ng/mL (ref 24–336)

## 2021-07-01 LAB — VITAMIN B12: Vitamin B-12: 375 pg/mL (ref 180–914)

## 2021-07-01 LAB — FOLATE: Folate: 11.6 ng/mL (ref >5.9)

## 2021-07-01 MED ORDER — POTASSIUM CHLORIDE CRYS ER 20 MEQ PO TBCR
40.0000 meq | EXTENDED_RELEASE_TABLET | Freq: Once | ORAL | Status: AC
  Administered 2021-07-01: 08:00:00 40 meq via ORAL
  Filled 2021-07-01: qty 2

## 2021-07-01 MED ORDER — POTASSIUM CHLORIDE CRYS ER 20 MEQ PO TBCR: 20.0000 meq | EXTENDED_RELEASE_TABLET | Freq: Every day | ORAL | 0 refills | Status: AC

## 2021-07-01 MED ORDER — POTASSIUM CHLORIDE CRYS ER 20 MEQ PO TBCR
40.0000 meq | EXTENDED_RELEASE_TABLET | Freq: Once | ORAL | Status: AC
  Administered 2021-07-01: 14:00:00 40 meq via ORAL
  Filled 2021-07-01: qty 2

## 2021-07-01 MED ORDER — POTASSIUM CHLORIDE CRYS ER 20 MEQ PO TBCR: 20.0000 meq | EXTENDED_RELEASE_TABLET | Freq: Two times a day (BID) | ORAL | 0 refills | Status: AC

## 2021-07-01 MED ORDER — TORSEMIDE 40 MG PO TABS: 40.0000 mg | ORAL_TABLET | Freq: Two times a day (BID) | ORAL | 0 refills | Status: AC

## 2021-07-01 MED ORDER — MAGNESIUM SULFATE 2 GM/50ML IV SOLN
2.0000 g | Freq: Once | INTRAVENOUS | Status: AC
  Administered 2021-07-01: 07:00:00 2 g via INTRAVENOUS
  Filled 2021-07-01: qty 50

## 2021-07-01 MED ORDER — MAGNESIUM SULFATE 2 GM/50ML IV SOLN
2.0000 g | Freq: Once | INTRAVENOUS | Status: AC
  Administered 2021-07-01: 14:00:00 2 g via INTRAVENOUS
  Filled 2021-07-01: qty 50

## 2021-07-01 MED ORDER — PERFLUTREN LIPID MICROSPHERE
1.0000 mL | INTRAVENOUS | Status: AC | PRN
  Administered 2021-07-01: 09:00:00 2 mL via INTRAVENOUS
  Filled 2021-07-01: qty 10

## 2021-07-01 NOTE — Progress Notes (Signed)
Lower extremity venous bilateral study completed.   Please see CV Proc for preliminary results.   Notnamed Scholz, RDMS, RVT  

## 2021-07-01 NOTE — Progress Notes (Signed)
°  Echocardiogram 2D Echocardiogram has been performed.  Augustine Radar 07/01/2021, 8:56 AM

## 2021-07-01 NOTE — TOC Progression Note (Signed)
Transition of Care [facility]) - Progression Note    Patient Details  Name: Garion Wempe MRN: [id number] Date of Birth: Jul 08, 1966  Transition of Care [facility]) CM/SW Contact  Leone Haven, RN Phone Number: 07/01/2021, 11:14 AM  Clinical Narrative:     Transition of Care [facility]) Screening Note   Patient Details  Name: Samad Thon Date of Birth: [birth year]   Transition of Care [facility]) CM/SW Contact:    Leone Haven, RN Phone Number: 07/01/2021, 11:14 AM    Transition of Care Department [facility]) has reviewed patient and no TOC needs have been identified at this time. We will continue to monitor patient advancement through interdisciplinary progression rounds. If new patient transition needs arise, please place a TOC consult.          Expected Discharge Plan and Services                                                 Social Determinants of Health (SDOH) Interventions    Readmission Risk Interventions No flowsheet data found.

## 2021-07-01 NOTE — Progress Notes (Addendum)
Heart Failure Navigator Progress Note  Assessed for Heart & Vascular TOC clinic readiness.  Patient does not meet criteria due to patient from [place], [place]. Traveling through [place] to visit daughters on his way to [place] for Christmas when SOB started while at the hotel. Pt states he had appt with PCP 12/19--added spironolactone. Pt states it didn't help. Has new cardiology appt set up in [facility] on 08/02/2021. Recommended trying to be seen early by cardiology in [place], [place]. Pt states he now plans to spend Christmas in [place] since the weather is poor, will be returning to [place], [place] and continuing medical care there.  Pt believes this started with poor diet intake after Thanksgiving. Pt states he has not been monitoring his sodium intake well.  Pt resting in bed on room air, speaking in full sentences. Ready to "get out of here, my legs look a whole lot better". Explained to give some time as medications need adjusting.   ECHO results pending.    Navigator available for educational resources.   Ozella Rocks, MSN, RN Heart Failure Nurse Navigator [phone]

## 2021-07-01 NOTE — Discharge Summary (Signed)
Physician Discharge Summary  Derek Chapman [id number] DOB: 1965/09/01 DOA: 06/30/2021  PCP: Patient, No Pcp Per (Inactive)  Admit date: 06/30/2021 Discharge date: 07/01/2021 30 Day Unplanned Readmission Risk Score    Flowsheet Row ED to Hosp-Admission (Current) from 06/30/2021 in [facility]  30 Day Unplanned Readmission Risk Score (%) 12.2 Filed at 07/01/2021 1200       This score is the patient's risk of an unplanned readmission within 30 days of being discharged (0 -100%). The score is based on dignosis, age, lab data, medications, orders, and past utilization.   Low:  0-14.9   Medium: 15-21.9   High: 22-29.9   Extreme: 30 and above          Admitted From: Home Disposition: Home  Recommendations for Outpatient Follow-up:  Follow up with PCP in 1-2 weeks Please obtain BMP/CBC in one week Follow-up with your cardiologist as soon as possible, ideally within 2 weeks Please follow up with your PCP on the following pending results: Unresulted Labs (From admission, onward)     Start     Ordered   07/07/21 0500  Creatinine, serum  (enoxaparin (LOVENOX)    CrCl >/= 30 ml/min)  Weekly,   R     Comments: while on enoxaparin therapy    06/30/21 2321   06/30/21 2320  HIV Antibody (routine testing w rflx)  (HIV Antibody (Routine testing w reflex) panel)  Once,   R        06/30/21 2321              Home Health: None Equipment/Devices: None  Discharge Condition: Stable CODE STATUS: Full code Diet recommendation: Low-sodium/cardiac  Subjective: Seen and examined.  He states that he feels a lot better.  No shortness of breath and he thinks that his edema has gone down significantly compared to yesterday.  Discussed the plan of care with the patient and I recommended that he stays in the hospital further night for IV diuresis however patient very adamant and requesting to discharge him today.  Following HPI and ED course is copied from my colleague  hospitalist Dr. Moise Boring H&P. HPI: Derek Chapman is a 55 y.o. male with history of chronic systolic heart failure last EF measured in October 2018 was 25 to 30% with history of diabetes mellitus type 2, hypertension and chronic kidney disease stage III presents to the ER because of increasing shortness of breath.  Patient states he was doing well until 3 weeks ago when he started noticing increasing lower extremity edema and shortness of breath.  About 2 weeks ago he had gone to the ER in [place] where he lives.  Over there in the ER patient's Lasix dose was increased and had followed up with his primary care physician last week when it was noticed that patient's symptoms has not improved so spironolactone was added.  Patient was driving to [facility] and was planning to go to [place] took a break in [place] to meet his daughter and was living in the motel when patient became more short of breath.  Denies any chest pain productive cough fever or chills.  He did recently take some NSAIDs for pain.   ED Course: In the ER patient has significant edema in both lower extremities extending up to thighs and also elevated JVD.  Chest x-ray shows mild edema.  Labs show BNP of 4100 potassium 3.2 creatinine 1.7 hemoglobin 12.7 EKG shows normal sinus rhythm with first-degree AV block.  COVID test was negative.  Patient was given Lasix 80 mg IV admitted for acute on chronic systolic heart failure.  Brief/Interim Summary: Briefly, patient was admitted with acute on chronic systolic congestive heart failure, his ejection fraction previously known was 25 to 30%.  Per patient, he was taking his Lasix 80 mg p.o. twice daily but recently since last 2 to 3 weeks, he has noticed that he is not putting out enough urine as he used to.  Despite of having shortness of breath and some vascular congestion on chest x-ray, he did not require any oxygen was never hypoxic.  He was started on high-dose IV Lasix 80 mg  twice daily.  Although there is no output charted in the chart but according to patient, he has put out at least 5 L since yesterday and his edema which is currently +1 only, was +2 yesterday according to patient.  I advised him to stay another night for another aggressive diuresis today and possibly discharge tomorrow but he is very adamant and wants to leave today.  He was planning to drive to [place] but according to him, now he has no plans and he is going to stay here for a week.  He was advised to seek medical attention if he needs to.  He does not think Lasix is working for him anymore so we discussed about torsemide and he wants me to prescribe him torsemide.  I am also going to prescribe him potassium chloride.  It is 3.4 today.  He will get replacement before discharge.  I have advised him not to take ibuprofen or NSAIDs.  He verbalized understanding.  Discharge Diagnoses:  Principal Problem:   Acute on chronic systolic CHF (congestive heart failure) (HCC) Active Problems:   CKD (chronic kidney disease) stage 3, GFR 30-59 ml/min (HCC)   Diabetes mellitus (HCC)   HTN (hypertension), benign   Acute CHF (congestive heart failure) ([place])    Discharge Instructions   Allergies as of 07/01/2021   No Known Allergies      Medication List     STOP taking these medications    furosemide 40 MG tablet Commonly known as: LASIX   furosemide 80 MG tablet Commonly known as: LASIX   lisinopril 20 MG tablet Commonly known as: ZESTRIL   naproxen sodium 220 MG tablet Commonly known as: ALEVE   sildenafil 50 MG tablet Commonly known as: Viagra       TAKE these medications    amLODipine 5 MG tablet Commonly known as: NORVASC take 1 tablet by mouth once daily   aspirin 81 MG tablet Take 81 mg by mouth daily.   carvedilol 25 MG tablet Commonly known as: COREG Take 1 tablet (25 mg total) by mouth 2 (two) times daily with a meal. MUST HAVE APT WITH CHF CLINIC FOR FUTURE  REFILLS.   Entresto 49-51 MG Generic drug: sacubitril-valsartan Take 1 tablet by mouth every 12 (twelve) hours.   metFORMIN 500 MG tablet Commonly known as: Glucophage Take 1 tablet (500 mg total) by mouth daily with breakfast. What changed: when to take this   multivitamin Tabs tablet Take 1 tablet by mouth daily.   OVER THE COUNTER MEDICATION Take 1 tablet by mouth daily as needed (when working out). Force factor Score!   potassium chloride SA 20 MEQ tablet Commonly known as: KLOR-CON M Take 1 tablet (20 mEq total) by mouth daily. What changed:  See the new instructions. Another medication with the same name was removed.  Continue taking this medication, and follow the directions you see here.   rosuvastatin 10 MG tablet Commonly known as: CRESTOR Take 10 mg by mouth daily.   spironolactone 25 MG tablet Commonly known as: ALDACTONE Take 1 tablet (25 mg total) by mouth daily.   tadalafil 5 MG tablet Commonly known as: CIALIS Take 5 mg by mouth daily as needed.   Torsemide 40 MG Tabs Take 40 mg by mouth 2 (two) times daily.        No Known Allergies  Consultations: None   Procedures/Studies: DG Chest 2 View  Result Date: 06/30/2021 CLINICAL DATA:  Shortness of breath. EXAM: CHEST - 2 VIEW COMPARISON:  March 03, 2014 FINDINGS: Mildly increased bilateral perihilar interstitial lung markings are seen. There is no evidence of focal consolidation, pleural effusion or pneumothorax. The cardiac silhouette is mildly enlarged. The visualized skeletal structures are unremarkable. IMPRESSION: Mildly increased bilateral perihilar interstitial lung markings which may represent mild interstitial edema. Electronically Signed   By: Aram Candela M.D.   On: 06/30/2021 20:38   VAS [place] LOWER EXTREMITY VENOUS (DVT)  Result Date: 07/01/2021  Lower Venous DVT Study Patient Name:  Derek Chapman  Date of Exam:   07/01/2021 Medical Rec #: [id number]     Accession #:    [id number]  Date of Birth: 25-May-1966     Patient Gender: M Patient Age:   55 years Exam Location:  [facility] Procedure:      VAS [place] LOWER EXTREMITY VENOUS (DVT) Referring Phys: Midge Minium --------------------------------------------------------------------------------  Indications: Swelling.  Risk Factors: CHF. Comparison Study: No prior studies. Performing Technologist: Jean Rosenthal RDMS, RVT  Examination Guidelines: A complete evaluation includes B-mode imaging, spectral Doppler, color Doppler, and power Doppler as needed of all accessible portions of each vessel. Bilateral testing is considered an integral part of a complete examination. Limited examinations for reoccurring indications may be performed as noted. The reflux portion of the exam is performed with the patient in reverse Trendelenburg.  +---------+---------------+---------+-----------+----------+--------------+  RIGHT     Compressibility Phasicity Spontaneity Properties Thrombus Aging  +---------+---------------+---------+-----------+----------+--------------+  CFV       Full            No        Yes                                    +---------+---------------+---------+-----------+----------+--------------+  SFJ       Full                                                             +---------+---------------+---------+-----------+----------+--------------+  FV Prox   Full                                                             +---------+---------------+---------+-----------+----------+--------------+  FV Mid    Full                                                             +---------+---------------+---------+-----------+----------+--------------+  FV Distal Full                                                             +---------+---------------+---------+-----------+----------+--------------+  PFV       Full                                                              +---------+---------------+---------+-----------+----------+--------------+  POP       Full            No        Yes                                    +---------+---------------+---------+-----------+----------+--------------+  PTV       Full                                                             +---------+---------------+---------+-----------+----------+--------------+  PERO      Full                                                             +---------+---------------+---------+-----------+----------+--------------+  Gastroc   Full                                                             +---------+---------------+---------+-----------+----------+--------------+   +---------+---------------+---------+-----------+----------+--------------+  LEFT      Compressibility Phasicity Spontaneity Properties Thrombus Aging  +---------+---------------+---------+-----------+----------+--------------+  CFV       Full            No        Yes                                    +---------+---------------+---------+-----------+----------+--------------+  SFJ       Full                                                             +---------+---------------+---------+-----------+----------+--------------+  FV Prox   Full                                                             +---------+---------------+---------+-----------+----------+--------------+  FV Mid    Full                                                             +---------+---------------+---------+-----------+----------+--------------+  FV Distal Full                                                             +---------+---------------+---------+-----------+----------+--------------+  PFV       Full                                                             +---------+---------------+---------+-----------+----------+--------------+  POP       Full            No        Yes                                     +---------+---------------+---------+-----------+----------+--------------+  PTV       Full                                                             +---------+---------------+---------+-----------+----------+--------------+  PERO      Full                                                             +---------+---------------+---------+-----------+----------+--------------+  Gastroc   Full                                                             +---------+---------------+---------+-----------+----------+--------------+    Summary: RIGHT: - There is no evidence of deep vein thrombosis in the lower extremity.  - A cystic structure is found in the popliteal fossa.  LEFT: - There is no evidence of deep vein thrombosis in the lower extremity.  - A cystic structure is found in the popliteal fossa.  *See table(s) above for measurements and observations.    Preliminary      Discharge Exam: Vitals:   07/01/21 0548 07/01/21 0735  BP: (!) 127/95 (!) 155/96  Pulse: 74 82  Resp:  20  Temp:    SpO2:  95%   Vitals:   06/30/21 2341 07/01/21 0417 07/01/21 0548 07/01/21 0735  BP: (!) 145/91 (!) 141/114 (!) 127/95 (!) 155/96  Pulse: 85 75 74 82  Resp: (!) 22 20  20   Temp: 98.6 F (37 C) 97.8 F (36.6 C)    TempSrc: Oral Oral  Oral  SpO2: 99% 100%  95%  Weight:      Height:        General: Pt is alert, awake, not in acute distress Cardiovascular: RRR, S1/S2 +, no rubs, no gallops Respiratory: CTA bilaterally, no wheezing, no rhonchi Abdominal: Soft, NT, ND, bowel sounds + Extremities: +1 pitting edema bilateral lower extremity, no cyanosis    The results of significant diagnostics from this hospitalization (including imaging, microbiology, ancillary and laboratory) are listed below for reference.     Microbiology: Recent Results (from the past 240 hour(s))  Resp Panel by RT-PCR (Flu A&B, Covid) Nasopharyngeal Swab     Status: None   Collection Time: 06/30/21  9:38 PM   Specimen:  Nasopharyngeal Swab; Nasopharyngeal(NP) swabs in vial transport medium  Result Value Ref Range Status   SARS Coronavirus 2 by RT PCR NEGATIVE NEGATIVE Final    Comment: (NOTE) SARS-CoV-2 target nucleic acids are NOT DETECTED.  The SARS-CoV-2 RNA is generally detectable in upper respiratory specimens during the acute phase of infection. The lowest concentration of SARS-CoV-2 viral copies this assay can detect is 138 copies/mL. A negative result does not preclude SARS-Cov-2 infection and should not be used as the sole basis for treatment or other patient management decisions. A negative result may occur with  improper specimen collection/handling, submission of specimen other than nasopharyngeal swab, presence of viral mutation(s) within the areas targeted by this assay, and inadequate number of viral copies(<138 copies/mL). A negative result must be combined with clinical observations, patient history, and epidemiological information. The expected result is Negative.  Fact Sheet for Patients:  EntrepreneurPulse.com.au  Fact Sheet for Healthcare Providers:  IncredibleEmployment.be  This test is no t yet approved or cleared by the [place] FDA and  has been authorized for detection and/or diagnosis of SARS-CoV-2 by FDA under an Emergency Use Authorization (EUA). This EUA will remain  in effect (meaning this test can be used) for the duration of the COVID-19 declaration under Section 564(b)(1) of the Act, 21 U.S.C.section 360bbb-3(b)(1), unless the authorization is terminated  or revoked sooner.       Influenza A by PCR NEGATIVE NEGATIVE Final   Influenza B by PCR NEGATIVE NEGATIVE Final    Comment: (NOTE) The Xpert Xpress SARS-CoV-2/FLU/RSV plus assay is intended as an aid in the diagnosis of influenza from Nasopharyngeal swab specimens and should not be used as a sole basis for treatment. Nasal washings and aspirates are unacceptable for  Xpert Xpress SARS-CoV-2/FLU/RSV testing.  Fact Sheet for Patients: EntrepreneurPulse.com.au  Fact Sheet for Healthcare Providers: IncredibleEmployment.be  This test is not yet approved or cleared by the [place] FDA and has been authorized for detection and/or diagnosis of SARS-CoV-2 by FDA under an Emergency Use Authorization (EUA). This EUA will remain in effect (meaning this test can be used) for the duration of the COVID-19 declaration under Section 564(b)(1) of the Act, 21 U.S.C. section 360bbb-3(b)(1), unless the authorization is terminated or revoked.  Performed at [facility], [place] [address] [place], [place] [address]      Labs: BNP (last 3 results) Recent Labs    06/30/21 2009  BNP [id number]*   Basic Metabolic Panel: Recent Labs  Lab 06/30/21 2009 07/01/21 0227 07/01/21 1033  NA 138 141 142  K 3.2* 3.3* 3.4*  CL 100 102  101  CO2 28 30 27   GLUCOSE 143* 171* 168*  BUN 22* 22* 22*  CREATININE 1.70* 1.62* 1.61*  CALCIUM 8.9 8.7* 8.7*  MG  --  1.6* 1.6*   Liver Function Tests: Recent Labs  Lab 06/30/21 2009  AST 21  ALT 22  ALKPHOS 77  BILITOT 1.0  PROT 6.4*  ALBUMIN 2.9*   No results for input(s): LIPASE, AMYLASE in the last 168 hours. No results for input(s): AMMONIA in the last 168 hours. CBC: Recent Labs  Lab 06/30/21 2009 07/01/21 0227  WBC 6.3 6.1  NEUTROABS 4.1  --   HGB 12.7* 11.8*  HCT 39.4 36.4*  MCV 65.9* 65.1*  PLT 216 207   Cardiac Enzymes: No results for input(s): CKTOTAL, CKMB, CKMBINDEX, TROPONINI in the last 168 hours. BNP: Invalid input(s): POCBNP CBG: Recent Labs  Lab 07/01/21 0612 07/01/21 1156  GLUCAP 142* 144*   D-Dimer No results for input(s): DDIMER in the last 72 hours. Hgb A1c Recent Labs    07/01/21 1033  HGBA1C 8.0*   Lipid Profile No results for input(s): CHOL, HDL, LDLCALC, TRIG, CHOLHDL, LDLDIRECT in the last 72 hours. Thyroid function  studies Recent Labs    07/01/21 0227  TSH 1.367   Anemia work up Recent Labs    07/01/21 0309  VITAMINB12 375  FOLATE 11.6  FERRITIN 31  TIBC 413  IRON 34*  RETICCTPCT 1.7   Urinalysis    Component Value Date/Time   COLORURINE YELLOW 05/14/2013 1210   APPEARANCEUR CLEAR 05/14/2013 1210   LABSPEC 1.014 05/14/2013 1210   PHURINE 7.5 05/14/2013 1210   GLUCOSEU 100 (A) 05/14/2013 1210   HGBUR NEGATIVE 05/14/2013 1210   BILIRUBINUR NEGATIVE 05/14/2013 1210   KETONESUR NEGATIVE 05/14/2013 1210   PROTEINUR 30 (A) 05/14/2013 1210   UROBILINOGEN 1.0 05/14/2013 1210   NITRITE NEGATIVE 05/14/2013 1210   LEUKOCYTESUR NEGATIVE 05/14/2013 1210   Sepsis Labs Invalid input(s): PROCALCITONIN,  WBC,  LACTICIDVEN Microbiology Recent Results (from the past 240 hour(s))  Resp Panel by RT-PCR (Flu A&B, Covid) Nasopharyngeal Swab     Status: None   Collection Time: 06/30/21  9:38 PM   Specimen: Nasopharyngeal Swab; Nasopharyngeal(NP) swabs in vial transport medium  Result Value Ref Range Status   SARS Coronavirus 2 by RT PCR NEGATIVE NEGATIVE Final    Comment: (NOTE) SARS-CoV-2 target nucleic acids are NOT DETECTED.  The SARS-CoV-2 RNA is generally detectable in upper respiratory specimens during the acute phase of infection. The lowest concentration of SARS-CoV-2 viral copies this assay can detect is 138 copies/mL. A negative result does not preclude SARS-Cov-2 infection and should not be used as the sole basis for treatment or other patient management decisions. A negative result may occur with  improper specimen collection/handling, submission of specimen other than nasopharyngeal swab, presence of viral mutation(s) within the areas targeted by this assay, and inadequate number of viral copies(<138 copies/mL). A negative result must be combined with clinical observations, patient history, and epidemiological information. The expected result is Negative.  Fact Sheet for Patients:   EntrepreneurPulse.com.au  Fact Sheet for Healthcare Providers:  IncredibleEmployment.be  This test is no t yet approved or cleared by the [place] FDA and  has been authorized for detection and/or diagnosis of SARS-CoV-2 by FDA under an Emergency Use Authorization (EUA). This EUA will remain  in effect (meaning this test can be used) for the duration of the COVID-19 declaration under Section 564(b)(1) of the Act, 21 U.S.C.section 360bbb-3(b)(1), unless the authorization is  terminated  or revoked sooner.       Influenza A by PCR NEGATIVE NEGATIVE Final   Influenza B by PCR NEGATIVE NEGATIVE Final    Comment: (NOTE) The Xpert Xpress SARS-CoV-2/FLU/RSV plus assay is intended as an aid in the diagnosis of influenza from Nasopharyngeal swab specimens and should not be used as a sole basis for treatment. Nasal washings and aspirates are unacceptable for Xpert Xpress SARS-CoV-2/FLU/RSV testing.  Fact Sheet for Patients: EntrepreneurPulse.com.au  Fact Sheet for Healthcare Providers: IncredibleEmployment.be  This test is not yet approved or cleared by the [place] FDA and has been authorized for detection and/or diagnosis of SARS-CoV-2 by FDA under an Emergency Use Authorization (EUA). This EUA will remain in effect (meaning this test can be used) for the duration of the COVID-19 declaration under Section 564(b)(1) of the Act, 21 U.S.C. section 360bbb-3(b)(1), unless the authorization is terminated or revoked.  Performed at [facility], [place] [address] [place], [place] [address]      Time coordinating discharge: Over 30 minutes  SIGNED:   Darliss Cheney, MD  [facility] 07/01/2021, 12:08 PM  If 7PM-7AM, please contact night-coverage [email or web address]

## 2021-07-01 NOTE — Progress Notes (Signed)
Patient is being discharged alert and oriented x 4 with no complaints of pain. Patient does have mild dyspnea on exertion but has new pharmacy order for torsemide faxed to [facility]. Pt will stay in the city for the holidays and states he will follow all of the MD orders given to him by Dr. Doristine Bosworth.  Care plan goals have been met and resolved. Education was given by two nurses providing care to him. Pt re-stated in his own words his teaching and states he understands all.  No signs nor symptoms of distress noted at this time.

## 2023-06-16 IMAGING — DX DG CHEST 2V
2 series · 2 of 2 positions shown · non-contrast
Comparison: March 03, 2014

CLINICAL DATA: Shortness of breath.

EXAM:
CHEST - 2 VIEW

[chest pa]
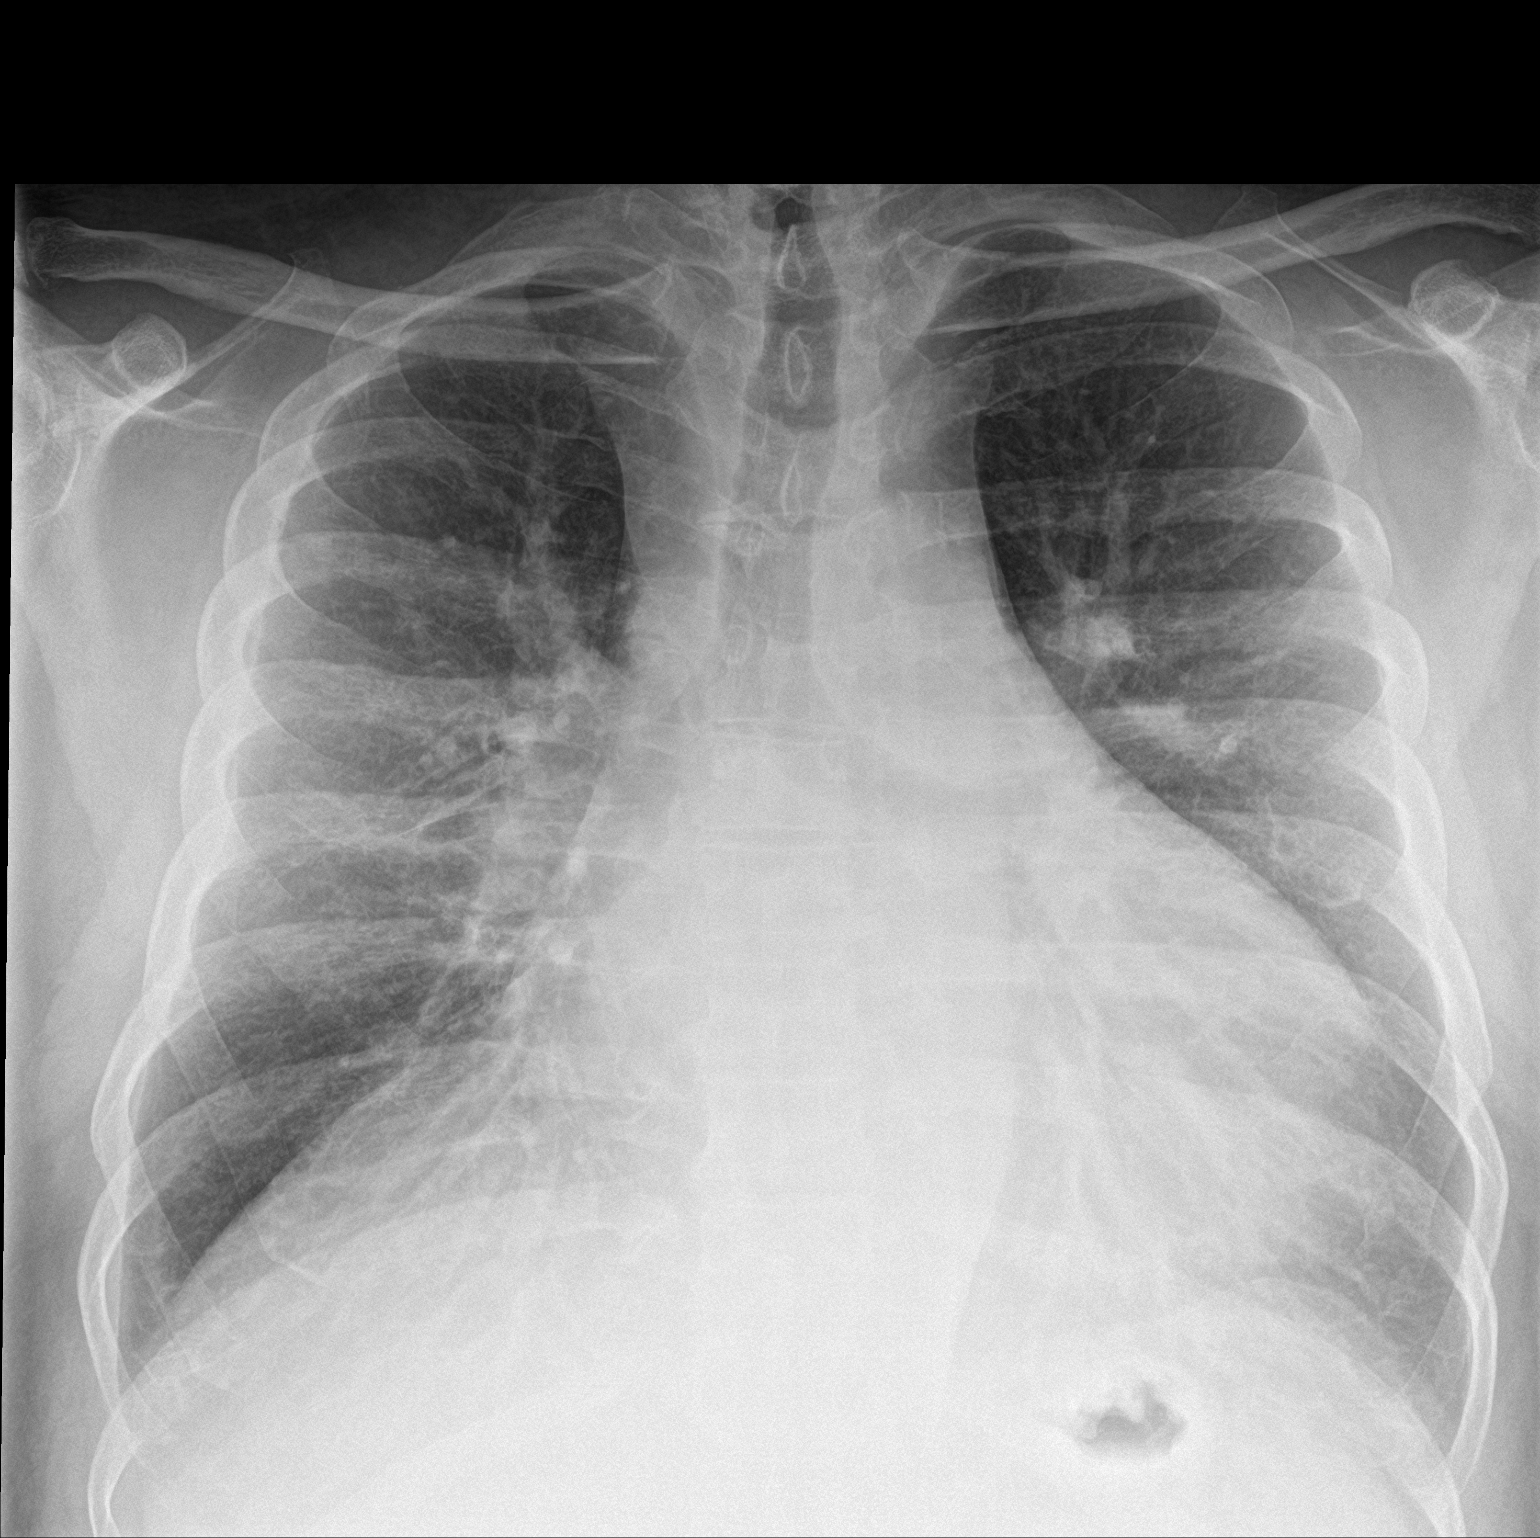

[chest lat]
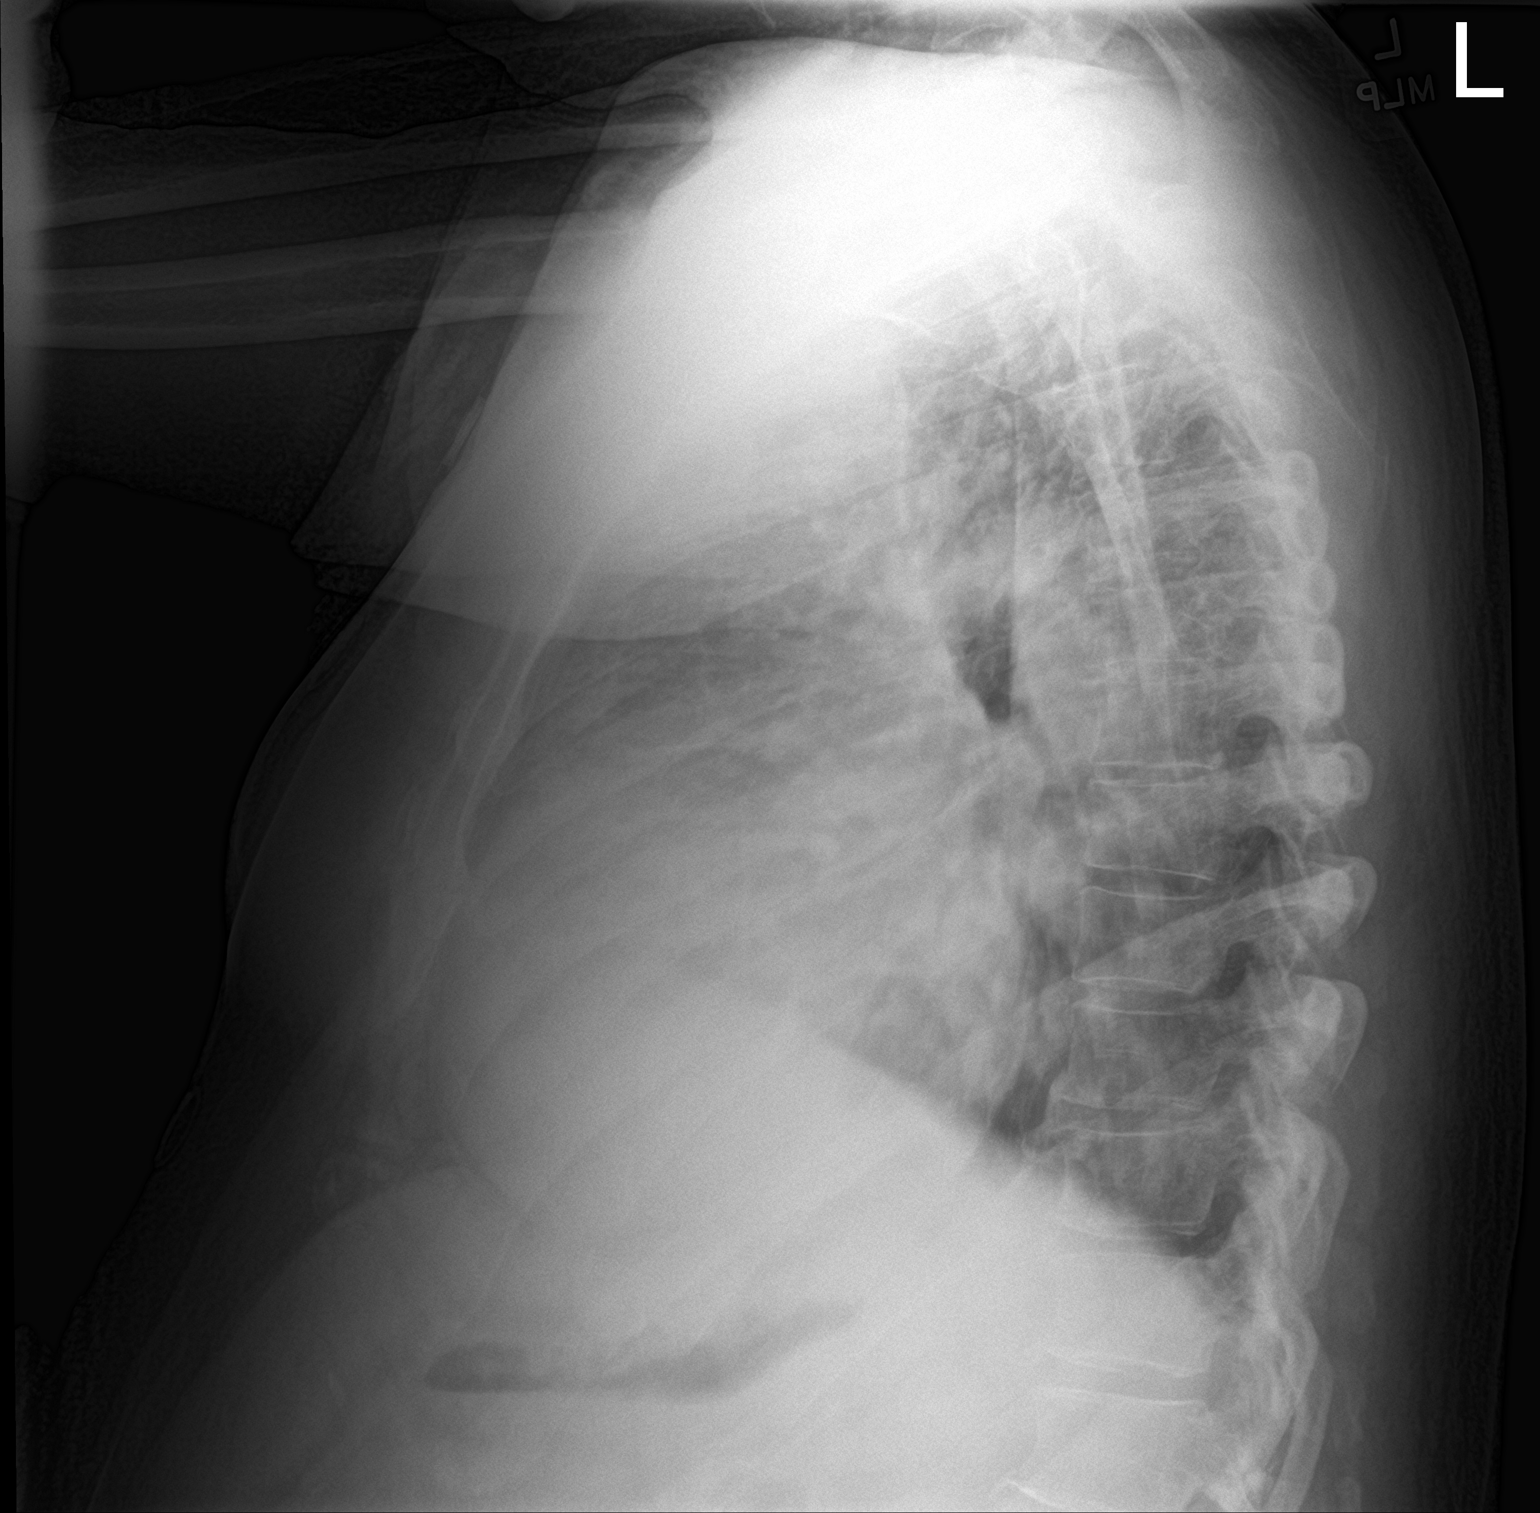

[2 of 2 positions shown; findings below may reference images not displayed]

FINDINGS: Mildly increased bilateral perihilar interstitial lung markings are
seen. There is no evidence of focal consolidation, pleural effusion
or pneumothorax. The cardiac silhouette is mildly enlarged. The
visualized skeletal structures are unremarkable.
IMPRESSION: Mildly increased bilateral perihilar interstitial lung markings
which may represent mild interstitial edema.
